# Patient Record
Sex: Male | Born: 1975 | Race: White | Hispanic: No | Marital: Married | Smoking: Never smoker
Health system: Southern US, Community
[De-identification: ages and names within clinical notes are randomized; demographics above are authoritative.]

## PROBLEM LIST (undated history)

## (undated) DIAGNOSIS — T7840XA Allergy, unspecified, initial encounter: Secondary | ICD-10-CM

## (undated) HISTORY — DX: Allergy, unspecified, initial encounter: T78.40XA

---

## 2001-07-15 ENCOUNTER — Ambulatory Visit (HOSPITAL_BASED_OUTPATIENT_CLINIC_OR_DEPARTMENT_OTHER): Admission: RE | Admit: 2001-07-15 | Discharge: 2001-07-15 | Payer: Self-pay | Admitting: General Surgery

## 2001-07-15 ENCOUNTER — Encounter (INDEPENDENT_AMBULATORY_CARE_PROVIDER_SITE_OTHER): Payer: Self-pay | Admitting: *Deleted

## 2010-02-01 ENCOUNTER — Emergency Department (HOSPITAL_BASED_OUTPATIENT_CLINIC_OR_DEPARTMENT_OTHER): Admission: EM | Admit: 2010-02-01 | Discharge: 2010-02-01 | Payer: Self-pay | Admitting: Emergency Medicine

## 2012-08-12 ENCOUNTER — Encounter: Payer: Managed Care, Other (non HMO) | Admitting: Family Medicine

## 2012-08-12 ENCOUNTER — Encounter: Payer: Self-pay | Admitting: Family Medicine

## 2012-08-12 VITALS — BP 119/78 | HR 78 | Temp 97.6°F | Ht 74.0 in | Wt 219.0 lb

## 2012-08-12 NOTE — Progress Notes (Signed)
This encounter was created in error - please disregard.

## 2012-08-13 ENCOUNTER — Ambulatory Visit: Payer: Managed Care, Other (non HMO) | Admitting: Physician Assistant

## 2013-05-19 ENCOUNTER — Other Ambulatory Visit: Payer: Self-pay | Admitting: Family Medicine

## 2013-05-22 NOTE — Telephone Encounter (Signed)
This may be refilled but please have this patient make an appt to be seen

## 2013-05-22 NOTE — Telephone Encounter (Signed)
Patient last seen in office on 01-13-11. Please advise

## 2013-05-26 ENCOUNTER — Ambulatory Visit (INDEPENDENT_AMBULATORY_CARE_PROVIDER_SITE_OTHER): Payer: Managed Care, Other (non HMO) | Admitting: Family Medicine

## 2013-05-26 VITALS — BP 137/77 | HR 81 | Temp 97.1°F | Ht 74.0 in | Wt 223.0 lb

## 2013-05-26 DIAGNOSIS — J3489 Other specified disorders of nose and nasal sinuses: Secondary | ICD-10-CM

## 2013-05-26 DIAGNOSIS — J111 Influenza due to unidentified influenza virus with other respiratory manifestations: Secondary | ICD-10-CM

## 2013-05-26 DIAGNOSIS — R509 Fever, unspecified: Secondary | ICD-10-CM

## 2013-05-26 DIAGNOSIS — R05 Cough: Secondary | ICD-10-CM

## 2013-05-26 DIAGNOSIS — R059 Cough, unspecified: Secondary | ICD-10-CM

## 2013-05-26 DIAGNOSIS — R0981 Nasal congestion: Secondary | ICD-10-CM

## 2013-05-26 LAB — POCT INFLUENZA A/B
Influenza A, POC: NEGATIVE
Influenza B, POC: POSITIVE

## 2013-05-26 MED ORDER — AZITHROMYCIN 250 MG PO TABS: ORAL_TABLET | ORAL | Status: DC

## 2013-05-26 MED ORDER — OSELTAMIVIR PHOSPHATE 75 MG PO CAPS: 75.0000 mg | ORAL_CAPSULE | Freq: Two times a day (BID) | ORAL | Status: DC

## 2013-05-26 NOTE — Progress Notes (Signed)
   Subjective:    Patient ID: Jerome Noble, male    DOB: 07/27/1975, 38 y.o.   MRN: [id number]  HPI  This 38 y.o. male presents for evaluation of cough, uri sx's, and fever.  Review of Systems No chest pain, SOB, HA, dizziness, vision change, N/V, diarrhea, constipation, dysuria, urinary urgency or frequency, myalgias, arthralgias or rash.     Objective:   Physical Exam  Vital signs noted  Well developed well nourished male.  HEENT - Head atraumatic Normocephalic                Eyes - PERRLA, Conjuctiva - clear Sclera- Clear EOMI                Ears - EAC's Wnl TM's Wnl Gross Hearing WNL                Nose - Nares patent                 Throat - oropharanx wnl Respiratory - Lungs CTA bilateral Cardiac - RRR S1 and S2 without murmur GI - Abdomen soft Nontender and bowel sounds active x 4 Extremities - No edema. Neuro - Grossly intact.      Results for orders placed in visit on 05/26/13  POCT INFLUENZA A/B      Result Value Ref Range   Influenza A, POC Negative     Influenza B, POC Positive     Assessment & Plan:  Nasal congestion - Plan: POCT Influenza A/B, oseltamivir (TAMIFLU) 75 MG capsule, azithromycin (ZITHROMAX) 250 MG tablet  Influenza with other respiratory manifestations - Plan: oseltamivir (TAMIFLU) 75 MG capsule, azithromycin (ZITHROMAX) 250 MG tablet  Push po fluids, rest, tylenol and motrin otc prn as directed for fever, arthralgias, and myalgias.  Follow up prn if sx's continue or persist.  Deatra CanterWilliam J Oxford FNP

## 2014-10-22 ENCOUNTER — Telehealth: Payer: Self-pay | Admitting: Family Medicine

## 2014-10-22 NOTE — Telephone Encounter (Signed)
appt scheduled. Patient aware. 

## 2014-10-28 ENCOUNTER — Ambulatory Visit (INDEPENDENT_AMBULATORY_CARE_PROVIDER_SITE_OTHER): Payer: Managed Care, Other (non HMO)

## 2014-10-28 ENCOUNTER — Ambulatory Visit (INDEPENDENT_AMBULATORY_CARE_PROVIDER_SITE_OTHER): Payer: Managed Care, Other (non HMO) | Admitting: Physician Assistant

## 2014-10-28 ENCOUNTER — Encounter: Payer: Self-pay | Admitting: Physician Assistant

## 2014-10-28 VITALS — BP 130/88 | HR 71 | Temp 97.2°F | Ht 74.0 in | Wt 222.0 lb

## 2014-10-28 DIAGNOSIS — R229 Localized swelling, mass and lump, unspecified: Secondary | ICD-10-CM

## 2014-10-28 DIAGNOSIS — M542 Cervicalgia: Secondary | ICD-10-CM

## 2014-10-28 DIAGNOSIS — R222 Localized swelling, mass and lump, trunk: Secondary | ICD-10-CM

## 2014-10-28 MED ORDER — MELOXICAM 15 MG PO TABS: 15.0000 mg | ORAL_TABLET | Freq: Every day | ORAL | Status: DC

## 2014-10-28 MED ORDER — PREDNISONE 10 MG (21) PO TBPK: ORAL_TABLET | ORAL | Status: DC

## 2014-10-28 NOTE — Progress Notes (Signed)
   Subjective:    Patient ID: Jerome Noble, male    DOB: Mar 16, 1976, 39 y.o.   MRN: [id number]  HPI 39 y/o male presents with c/o enlarging mass on left medial shoulder blade and neck pain. He is not having pain today but has had intermittent neck pain since waking up 2 weeks ago. Pain is intermittent. Worse with stretching. Has not tried any medications.     Review of Systems  Constitutional: Negative.   HENT: Negative.   Respiratory: Negative.   Cardiovascular: Negative.   Gastrointestinal: Negative.   Endocrine: Negative.   Genitourinary: Negative.   Musculoskeletal: Positive for neck pain (posterior , bilateral ). Negative for back pain and neck stiffness.  Skin:       Enlarging mass on left back        Objective:   Physical Exam  Constitutional: He is oriented to person, place, and time.  Musculoskeletal: Normal range of motion. He exhibits tenderness (Mild ttp of bilateral paraspinal areas of cervical spine, no abnormalities noted on exam ).  Xray shows mild degenerative changes and decreased joint space. No acute abnormality   Neurological: He is alert and oriented to person, place, and time.  Skin: No erythema.  Palpable, mobile 2cm in diameter subcutaneous mass medial and superior to left scapula. Non ttp   Negative for erythema or discoloration   Psychiatric: He has a normal mood and affect. His behavior is normal. Judgment and thought content normal.          Assessment & Plan:  1. Neck pain  - DG Cervical Spine Complete - predniSONE (STERAPRED UNI-PAK 21 TAB) 10 MG (21) TBPK tablet; 6 pills PO on day 1, 5 on day 2, 4 on day 3, 3 on day 4, 2 on day 5, 1 on day 6  Dispense: 21 tablet; Refill: 0 - meloxicam (MOBIC) 15 MG tablet; Take 1 tablet (15 mg total) by mouth daily.  Dispense: 30 tablet; Refill: 0  2. Mass on back  - DG Cervical Spine Complete   F/u in 2 weeks if no improvement   Kali Ambler A. Chauncey Reading PA-C

## 2015-04-28 ENCOUNTER — Encounter: Payer: Self-pay | Admitting: Family Medicine

## 2015-04-28 ENCOUNTER — Ambulatory Visit (INDEPENDENT_AMBULATORY_CARE_PROVIDER_SITE_OTHER): Payer: Managed Care, Other (non HMO) | Admitting: Family Medicine

## 2015-04-28 VITALS — BP 130/79 | HR 80 | Temp 97.5°F | Ht 74.0 in | Wt 219.0 lb

## 2015-04-28 DIAGNOSIS — J0100 Acute maxillary sinusitis, unspecified: Secondary | ICD-10-CM | POA: Diagnosis not present

## 2015-04-28 MED ORDER — AMOXICILLIN-POT CLAVULANATE 875-125 MG PO TABS: 1.0000 | ORAL_TABLET | Freq: Two times a day (BID) | ORAL | Status: DC

## 2015-04-28 NOTE — Progress Notes (Signed)
   Subjective:    Patient ID: Jerome Noble, male    DOB: 15-Jan-1976, 40 y.o.   MRN: [id number]  HPI Patient here today for sinus drainage and pressure that started about 4-5 days ago. She has a history of allergic rhinitis but thinks he may have infection now with the pressure and headache. He's had no fever. There has been some colored drainage from sinuses at least coughs up. He is planning air plane ride tomorrow to [place] for skiing.      There are no active problems to display for this patient.  Outpatient Encounter Prescriptions as of 04/28/2015  Medication Sig  . [DISCONTINUED] meloxicam (MOBIC) 15 MG tablet Take 1 tablet (15 mg total) by mouth daily.  . [DISCONTINUED] predniSONE (STERAPRED UNI-PAK 21 TAB) 10 MG (21) TBPK tablet 6 pills PO on day 1, 5 on day 2, 4 on day 3, 3 on day 4, 2 on day 5, 1 on day 6   No facility-administered encounter medications on file as of 04/28/2015.      Review of Systems  Constitutional: Negative.  Negative for fever.  HENT: Positive for congestion, postnasal drip and sinus pressure.   Eyes: Negative.   Respiratory: Negative.   Cardiovascular: Negative.   Gastrointestinal: Negative.   Endocrine: Negative.   Genitourinary: Negative.   Musculoskeletal: Negative.   Skin: Negative.   Allergic/Immunologic: Negative.   Neurological: Positive for headaches.  Hematological: Negative.   Psychiatric/Behavioral: Negative.        Objective:   Physical Exam  Constitutional: He appears well-developed and well-nourished.  HENT:  Mouth/Throat: Oropharynx is clear and moist.  Frontal sinuses are tender to percussion  Pulmonary/Chest: Breath sounds normal.    BP 130/79 mmHg  Pulse 80  Temp(Src) 97.5 F (36.4 C) (Oral)  Ht  (1.88 m)  Wt 219 lb (99.338 kg)  BMI 28.11 kg/m2       Assessment & Plan:  1. Acute maxillary sinusitis, recurrence not specified Continue Mucinex. May use Afrin prior to flying. Rx Augmentin 875 twice a day for  10 days  Frederica Kuster MD

## 2015-06-29 ENCOUNTER — Encounter (INDEPENDENT_AMBULATORY_CARE_PROVIDER_SITE_OTHER): Payer: Self-pay

## 2015-06-29 ENCOUNTER — Ambulatory Visit (INDEPENDENT_AMBULATORY_CARE_PROVIDER_SITE_OTHER): Payer: Managed Care, Other (non HMO)

## 2015-06-29 ENCOUNTER — Encounter: Payer: Self-pay | Admitting: Family Medicine

## 2015-06-29 ENCOUNTER — Ambulatory Visit (INDEPENDENT_AMBULATORY_CARE_PROVIDER_SITE_OTHER): Payer: Managed Care, Other (non HMO) | Admitting: Family Medicine

## 2015-06-29 VITALS — BP 127/80 | HR 73 | Temp 97.0°F | Ht 74.0 in | Wt 222.6 lb

## 2015-06-29 DIAGNOSIS — L03113 Cellulitis of right upper limb: Secondary | ICD-10-CM

## 2015-06-29 DIAGNOSIS — M7021 Olecranon bursitis, right elbow: Secondary | ICD-10-CM | POA: Diagnosis not present

## 2015-06-29 DIAGNOSIS — M25521 Pain in right elbow: Secondary | ICD-10-CM

## 2015-06-29 MED ORDER — SULFAMETHOXAZOLE-TRIMETHOPRIM 800-160 MG PO TABS: 1.0000 | ORAL_TABLET | Freq: Two times a day (BID) | ORAL | Status: DC

## 2015-06-29 MED ORDER — PREDNISONE 20 MG PO TABS: ORAL_TABLET | ORAL | Status: DC

## 2015-06-29 NOTE — Progress Notes (Signed)
BP 127/80 mmHg  Pulse 73  Temp(Src) 97 F (36.1 C) (Oral)  Ht  (1.88 m)  Wt 222 lb 9.6 oz (100.971 kg)  BMI 28.57 kg/m2   Subjective:    Patient ID: Jerome Noble, male    DOB: 1975-09-20, 40 y.o.   MRN: [id number]  HPI: Jerome Noble is a 40 y.o. male presenting on 06/29/2015 for Right elbow pain   HPI Right elbow pain Patient has been having a small amount of right elbow pain for the past 2 weeks since a skiing incident where he fell directly onto that right elbow. He has had more significant swelling and pain increasing over the past 2-3 days. He has also developed some redness and warmth over that elbow. The swelling and warmth and pain is on the extensor surface of his elbow. He denies any fevers or chills or skin changes anywhere else. He denies any difficulties with range of motion and there but it does hurt to fully extend it because of the swelling. He denies any pain anywhere else.  Relevant past medical, surgical, family and social history reviewed and updated as indicated. Interim medical history since our last visit reviewed. Allergies and medications reviewed and updated.  Review of Systems  Constitutional: Negative for fever.  HENT: Negative for ear discharge and ear pain.   Eyes: Negative for discharge and visual disturbance.  Respiratory: Negative for shortness of breath and wheezing.   Cardiovascular: Negative for chest pain and leg swelling.  Gastrointestinal: Negative for abdominal pain, diarrhea and constipation.  Genitourinary: Negative for difficulty urinating.  Musculoskeletal: Positive for joint swelling and arthralgias. Negative for back pain and gait problem.  Skin: Positive for color change. Negative for rash and wound.  Neurological: Negative for syncope, light-headedness and headaches.  All other systems reviewed and are negative.   Per HPI unless specifically indicated above     Medication List       This list is accurate as of: 06/29/15  5:02  PM.  Always use your most recent med list.               predniSONE 20 MG tablet  Commonly known as:  DELTASONE  2 po at same time daily for 5 days     sulfamethoxazole-trimethoprim 800-160 MG tablet  Commonly known as:  BACTRIM DS  Take 1 tablet by mouth 2 (two) times daily.           Objective:    BP 127/80 mmHg  Pulse 73  Temp(Src) 97 F (36.1 C) (Oral)  Ht  (1.88 m)  Wt 222 lb 9.6 oz (100.971 kg)  BMI 28.57 kg/m2  Wt Readings from Last 3 Encounters:  06/29/15 222 lb 9.6 oz (100.971 kg)  04/28/15 219 lb (99.338 kg)  10/28/14 222 lb (100.699 kg)    Physical Exam  Constitutional: He is oriented to person, place, and time. He appears well-developed and well-nourished. No distress.  Eyes: Conjunctivae and EOM are normal. Pupils are equal, round, and reactive to light. Right eye exhibits no discharge. No scleral icterus.  Cardiovascular: Normal rate, regular rhythm, normal heart sounds and intact distal pulses.   No murmur heard. Pulmonary/Chest: Effort normal and breath sounds normal. No respiratory distress. He has no wheezes.  Musculoskeletal: Normal range of motion. He exhibits no edema.       Right elbow: He exhibits swelling. He exhibits normal range of motion, no deformity and no laceration. Tenderness found. Olecranon process (Tenderness overlying the olecranon  process, also the location of the bursa sac, significant swelling and erythema and warmth noted as well.) tenderness noted.  Neurological: He is alert and oriented to person, place, and time. Coordination normal.  Skin: Skin is warm and dry. No rash noted. He is not diaphoretic.  Psychiatric: He has a normal mood and affect. His behavior is normal.  Vitals reviewed.   Elbow x-ray: No acute bony abnormalities are noted. Soft tissue edema and swelling is noted on x-ray. Await radiologist's final read.    Assessment & Plan:   Problem List Items Addressed This Visit    None    Visit Diagnoses     Elbow pain, right    -  Primary    Relevant Orders    DG Elbow 2 Views Right    Bursitis of elbow, right        Concern for bursitis versus cellulitis, will send prednisone and antibiotic    Relevant Medications    sulfamethoxazole-trimethoprim (BACTRIM DS) 800-160 MG tablet    predniSONE (DELTASONE) 20 MG tablet    Cellulitis of right upper extremity        Relevant Medications    sulfamethoxazole-trimethoprim (BACTRIM DS) 800-160 MG tablet    predniSONE (DELTASONE) 20 MG tablet       Follow up plan: Return if symptoms worsen or fail to improve.  Counseling provided for all of the vaccine components Orders Placed This Encounter  Procedures  . DG Elbow 2 Views Right    Arville CareJoshua Chanetta Moosman, MD [facility] 06/29/2015, 5:02 PM

## 2017-04-02 IMAGING — CR DG CERVICAL SPINE COMPLETE 4+V
5 series · 5 of 5 positions shown · non-contrast
Comparison: None in PACs

CLINICAL DATA: Neck pain without

EXAM:
CERVICAL SPINE  4+ VIEWS

[view not recorded (1 of 5)]
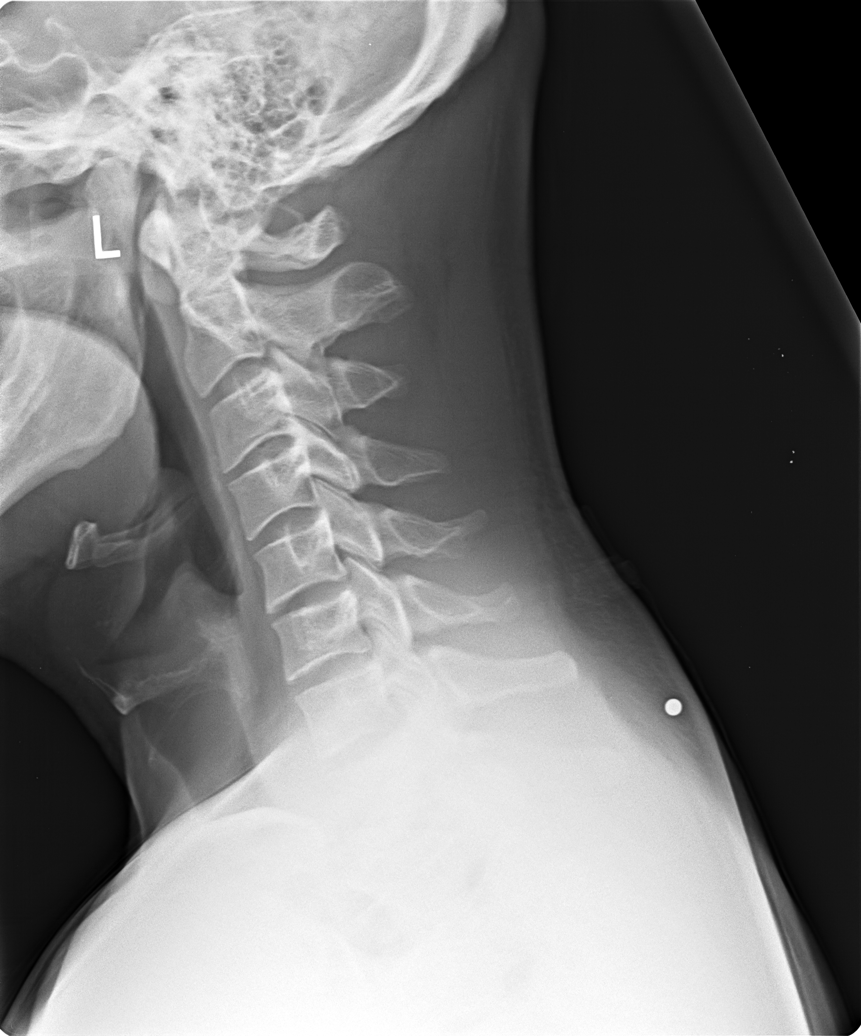

[view not recorded (2 of 5)]
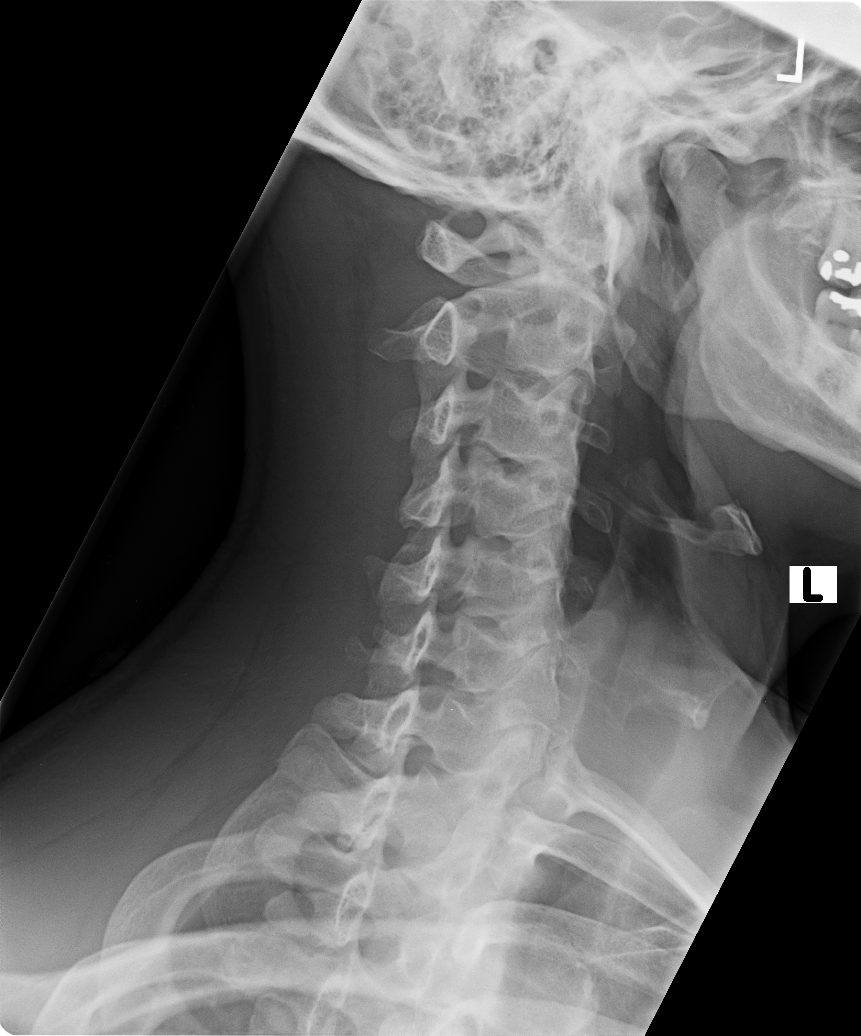

[view not recorded (3 of 5)]
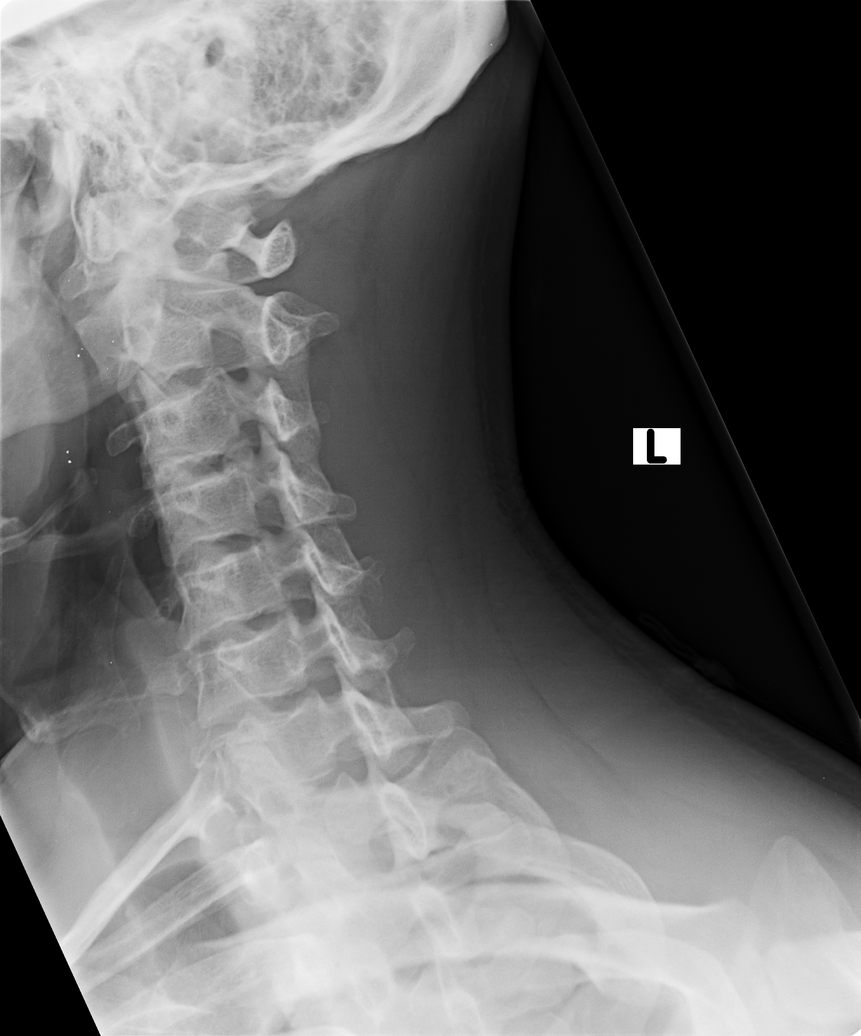

[view not recorded (4 of 5)]
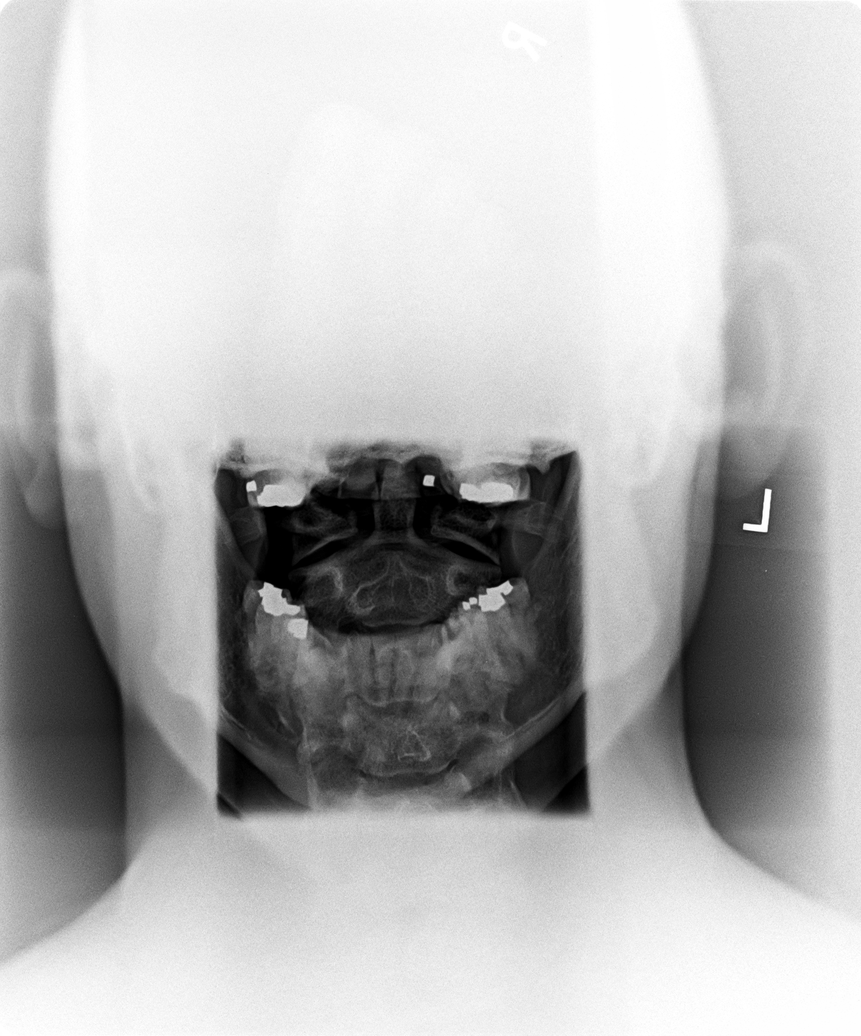

[view not recorded (5 of 5)]
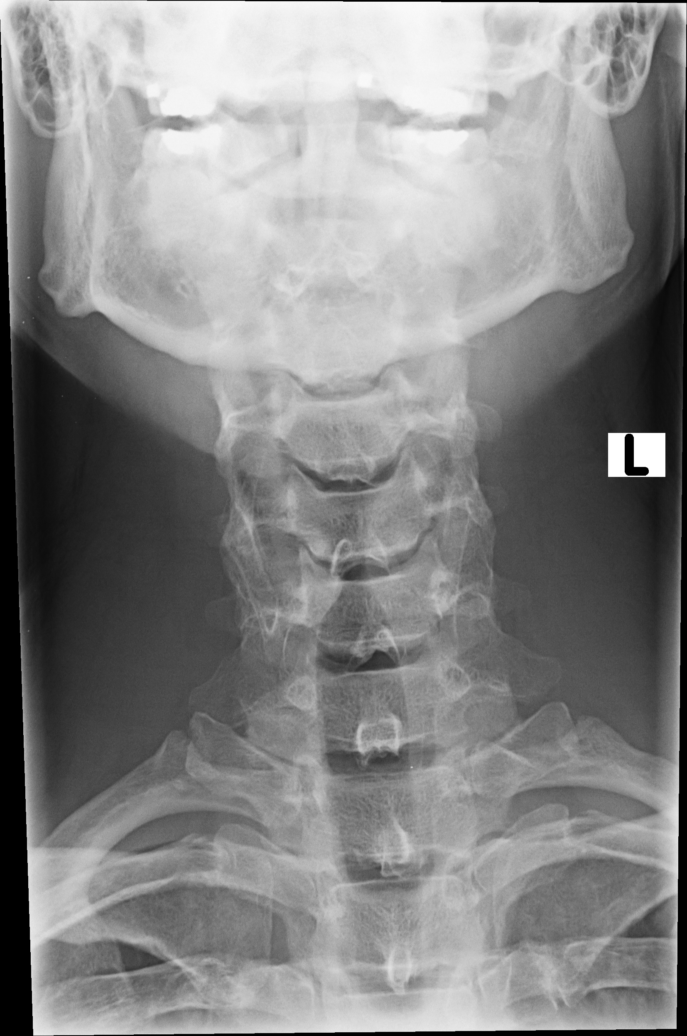

[5 of 5 positions shown; findings below may reference images not displayed]

FINDINGS: There is mild loss of the normal cervical lordosis. The cervical
vertebral bodies are preserved in height. The disc space heights are
reasonably well-maintained. There is no perched facet. The oblique
views reveal mild bony encroachment upon the C3 neural foramen
bilaterally. The prevertebral soft tissue spaces are normal. The
odontoid is intact
IMPRESSION: Mild encroachment upon the neural foramina at the C3 level
bilaterally. Mild loss of the normal cervical lordosis suggests
muscle spasm.

If there are radicular symptoms, further evaluation with MRI would
be useful.

## 2017-09-09 ENCOUNTER — Ambulatory Visit: Payer: Managed Care, Other (non HMO) | Admitting: Family Medicine

## 2017-09-09 ENCOUNTER — Encounter: Payer: Self-pay | Admitting: Family Medicine

## 2017-09-09 VITALS — BP 99/66 | HR 82 | Temp 97.5°F | Ht 74.0 in | Wt 221.0 lb

## 2017-09-09 DIAGNOSIS — M545 Low back pain, unspecified: Secondary | ICD-10-CM

## 2017-09-09 MED ORDER — PREDNISONE 10 MG PO TABS: ORAL_TABLET | ORAL | 0 refills | Status: DC

## 2017-09-09 NOTE — Patient Instructions (Signed)
Great to see you!  Let me know if you are not getting better.

## 2017-09-09 NOTE — Progress Notes (Signed)
   HPI  Patient presents today for back pain.  Patient explains about 3 to 4 weeks ago he began having midline low back pain.  He states that it is in one area and has intermittent sharp shooting pains 6-8 times a day.  He does not have any leg symptoms or leg weakness. No injury. He states that this typically comes on a couple times a year and last no more than a day or 2, however it lasted 3 or 4 weeks this time.  It has still come and gone some in 3 or 4 weeks but over the last 3 days has been present most of the time.  He has worsening pain with rotating his back in a very specific way.  No pain with flexion or extension.  PMH: Smoking status noted ROS: Per HPI  Objective: BP 99/66 (BP Location: Right Arm, Patient Position: Sitting, Cuff Size: Normal)   Pulse 82   Temp (!) 97.5 F (36.4 C) (Oral)   Ht 6\' 2"  (1.88 m)   Wt 221 lb (100.2 kg)   BMI 28.37 kg/m  Gen: NAD, alert, cooperative with exam HEENT: NCAT CV: RRR, good S1/S2, no murmur Resp: CTABL, no wheezes, non-labored Ext: No edema, warm Neuro: Alert and oriented, No gross deficits MSK:  No midline tenderness to palpation of the lumbar spine, no paraspinal muscle tenderness to palpation Negative Pearlean BrownieFaber testing bilateral hips Negative modified straight leg raise Strength preserved bilateral lower extremities  Assessment and plan:  #Acute midline low back pain Recent onset persistent back pain with mostly intermittent bursts of sharp pain that are nonradiating. Not helped by 800 mg ibuprofen No injury He has seen his chiropractor who did an x-ray, he was told there were no broken bones. He's had short-lived improvement after chiropractic adjustment. 12-day course of prednisone given   Meds ordered this encounter  Medications  . predniSONE (DELTASONE) 10 MG tablet    Sig: Take 4 pills a day for 3 days, then 3 pills a day for 3 days, then 2 pills a day for 3 days, then 1 pill a day for 3 days, then stop   Dispense:  30 tablet    Refill:  0    Murtis SinkSam Teneil Shiller, MD [facility] 09/09/2017, 2:28 PM

## 2018-09-04 ENCOUNTER — Encounter (INDEPENDENT_AMBULATORY_CARE_PROVIDER_SITE_OTHER): Payer: Self-pay

## 2021-03-26 ENCOUNTER — Other Ambulatory Visit: Payer: Self-pay

## 2021-03-26 ENCOUNTER — Ambulatory Visit
Admission: EM | Admit: 2021-03-26 | Discharge: 2021-03-26 | Disposition: A | Discharge status: Home / Self Care | Payer: Managed Care, Other (non HMO) | Attending: Family Medicine | Admitting: Family Medicine

## 2021-03-26 DIAGNOSIS — J029 Acute pharyngitis, unspecified: Secondary | ICD-10-CM | POA: Diagnosis not present

## 2021-03-26 DIAGNOSIS — J069 Acute upper respiratory infection, unspecified: Secondary | ICD-10-CM | POA: Insufficient documentation

## 2021-03-26 DIAGNOSIS — R52 Pain, unspecified: Secondary | ICD-10-CM | POA: Insufficient documentation

## 2021-03-26 LAB — POCT RAPID STREP A (OFFICE): Rapid Strep A Screen: NEGATIVE

## 2021-03-26 MED ORDER — PREDNISONE 20 MG PO TABS: 40.0000 mg | ORAL_TABLET | Freq: Every day | ORAL | 0 refills | Status: DC

## 2021-03-26 MED ORDER — LIDOCAINE VISCOUS HCL 2 % MT SOLN: 10.0000 mL | OROMUCOSAL | 0 refills | Status: DC | PRN

## 2021-03-26 NOTE — ED Triage Notes (Signed)
Pt presents with sore throat for past 3 days , reports white patches on throat

## 2021-03-28 LAB — CULTURE, GROUP A STREP (THRC)

## 2021-03-30 NOTE — ED Provider Notes (Signed)
[facility]    CSN: [id number] Arrival date & time: 03/26/21  1210      History   Chief Complaint Chief Complaint  Patient presents with   Sore Throat    HPI Jerome Noble is a 46 y.o. male.   Presenting today with sore, swollen feeling throat with white patches x 3 days. Denies congestion, cough, fever, chills, body aches, CP, SOB. Not trying anything OTC for sxs. No known sick contacts.    Past Medical History:  Diagnosis Date   Allergy     There are no problems to display for this patient.   History reviewed. No pertinent surgical history.     Home Medications    Prior to Admission medications   Medication Sig Start Date End Date Taking? Authorizing Provider  lidocaine (XYLOCAINE) 2 % solution Use as directed 10 mLs in the mouth or throat every 3 (three) hours as needed for mouth pain. 03/26/21  Yes Volney American, PA-C  predniSONE (DELTASONE) 20 MG tablet Take 2 tablets (40 mg total) by mouth daily with breakfast. 03/26/21  Yes Volney American, PA-C  predniSONE (DELTASONE) 10 MG tablet Take 4 pills a day for 3 days, then 3 pills a day for 3 days, then 2 pills a day for 3 days, then 1 pill a day for 3 days, then stop 09/09/17   Timmothy Euler, MD    Family History Family History  Problem Relation Age of Onset   Heart disease Mother    Hyperlipidemia Mother     Social History Social History   Tobacco Use   Smoking status: Never   Smokeless tobacco: Former  Substance Use Topics   Alcohol use: Yes    Alcohol/week: 20.0 standard drinks    Types: 20 Cans of beer per week   Drug use: No     Allergies   Azelastine hcl and Flonase [fluticasone propionate]   Review of Systems Review of Systems PER HPI  Physical Exam Triage Vital Signs ED Triage Vitals  Enc Vitals Group     BP 03/26/21 1414 (!) 146/86     Pulse Rate 03/26/21 1414 67     Resp 03/26/21 1414 20     Temp 03/26/21 1414 98.1 F (36.7 C)     Temp src --       SpO2 03/26/21 1414 96 %     Weight --      Height --      Head Circumference --      Peak Flow --      Pain Score 03/26/21 1410 7     Pain Loc --      Pain Edu? --      Excl. in [place]? --    No data found.  Updated Vital Signs BP (!) 146/86    Pulse 67    Temp 98.1 F (36.7 C)    Resp 20    SpO2 96%   Visual Acuity Right Eye Distance:   Left Eye Distance:   Bilateral Distance:    Right Eye Near:   Left Eye Near:    Bilateral Near:     Physical Exam Vitals and nursing note reviewed.  Constitutional:      Appearance: Normal appearance. He is well-developed.  HENT:     Head: Atraumatic.     Right Ear: External ear normal.     Left Ear: External ear normal.     Nose: Nose normal.     Mouth/Throat:  Mouth: Mucous membranes are moist.     Pharynx: Posterior oropharyngeal erythema present. No oropharyngeal exudate.  Eyes:     Extraocular Movements: Extraocular movements intact.     Conjunctiva/sclera: Conjunctivae normal.     Pupils: Pupils are equal, round, and reactive to light.  Cardiovascular:     Rate and Rhythm: Normal rate and regular rhythm.  Pulmonary:     Effort: Pulmonary effort is normal. No respiratory distress.     Breath sounds: Normal breath sounds. No rales.  Musculoskeletal:        General: Normal range of motion.     Cervical back: Normal range of motion and neck supple.  Lymphadenopathy:     Cervical: No cervical adenopathy.  Skin:    General: Skin is warm and dry.  Neurological:     General: No focal deficit present.     Mental Status: He is alert and oriented to person, place, and time.  Psychiatric:        Mood and Affect: Mood normal.        Behavior: Behavior normal.        Thought Content: Thought content normal.        Judgment: Judgment normal.     UC Treatments / Results  Labs (all labs ordered are listed, but only abnormal results are displayed) Labs Reviewed  CULTURE, GROUP A STREP [facility])  POCT RAPID STREP A (OFFICE)     EKG   Radiology No results found.  Procedures Procedures (including critical care time)  Medications Ordered in UC Medications - No data to display  Initial Impression / Assessment and Plan / UC Course  I have reviewed the triage vital signs and the nursing notes.  Pertinent labs & imaging results that were available during my care of the patient were reviewed by me and considered in my medical decision making (see chart for details).     Rapid strep neg, throat culture pending. Treat with prednisone, viscous lidocaine and monitor for benefit. Return for worsening sxs.  Final Clinical Impressions(s) / UC Diagnoses   Final diagnoses:  Sore throat  Viral URI  Body aches   Discharge Instructions   None    ED Prescriptions     Medication Sig Dispense Auth. Provider   predniSONE (DELTASONE) 20 MG tablet Take 2 tablets (40 mg total) by mouth daily with breakfast. 6 tablet Volney American, PA-C   lidocaine (XYLOCAINE) 2 % solution Use as directed 10 mLs in the mouth or throat every 3 (three) hours as needed for mouth pain. 100 mL Volney American, [place]      PDMP not reviewed this encounter.   Volney American, [place] 03/30/21 2326

## 2022-11-24 NOTE — Progress Notes (Unsigned)
New Patient Office Visit  Subjective    Patient ID: Jerome Noble, male    DOB: 1975-07-05  Age: 47 y.o. MRN: [id number]  CC: No chief complaint on file.   HPI Jerome Noble presents to establish care ***  Outpatient Encounter Medications as of 11/27/2022  Medication Sig   lidocaine (XYLOCAINE) 2 % solution Use as directed 10 mLs in the mouth or throat every 3 (three) hours as needed for mouth pain.   predniSONE (DELTASONE) 10 MG tablet Take 4 pills a day for 3 days, then 3 pills a day for 3 days, then 2 pills a day for 3 days, then 1 pill a day for 3 days, then stop   predniSONE (DELTASONE) 20 MG tablet Take 2 tablets (40 mg total) by mouth daily with breakfast.   No facility-administered encounter medications on file as of 11/27/2022.    Past Medical History:  Diagnosis Date   Allergy     No past surgical history on file.  Family History  Problem Relation Age of Onset   Heart disease Mother    Hyperlipidemia Mother     Social History   Socioeconomic History   Marital status: Married    Spouse name: Not on file   Number of children: Not on file   Years of education: Not on file   Highest education level: Not on file  Occupational History   Not on file  Tobacco Use   Smoking status: Never   Smokeless tobacco: Former  Substance and Sexual Activity   Alcohol use: Yes    Alcohol/week: 20.0 standard drinks of alcohol    Types: 20 Cans of beer per week   Drug use: No   Sexual activity: Not on file  Other Topics Concern   Not on file  Social History Narrative   Not on file   Social Determinants of Health   Financial Resource Strain: Not on file  Food Insecurity: Not on file  Transportation Needs: Not on file  Physical Activity: Not on file  Stress: Not on file  Social Connections: Unknown (08/06/2021)   [facility]   Social Network    Social Network: Not on file  Intimate Partner Violence: Unknown (06/28/2021)   Received from [facility]    HITS    Physically Hurt: Not on file    Insult or Talk Down To: Not on file    Threaten Physical Harm: Not on file    Scream or Curse: Not on file    ROS Negative unless indicated in HPI   Objective    There were no vitals taken for this visit.  Physical Exam     Assessment & Plan:  There are no diagnoses linked to this encounter.      Continue healthy lifestyle choices, including diet (rich in fruits, vegetables, and lean proteins, and low in salt and simple carbohydrates) and exercise (at least 30 minutes of moderate physical activity daily).     The above assessment and management plan was discussed with the patient. The patient verbalized understanding of and has agreed to the management plan. Patient is aware to call the clinic if they develop any new symptoms or if symptoms persist or worsen. Patient is aware when to return to the clinic for a follow-up visit. Patient educated on when it is appropriate to go to the emergency department.  No follow-ups on file.   Arrie Aran Santa Lighter, [place] [facility] [address]  [place], [place] [address] [phone]

## 2022-11-27 ENCOUNTER — Ambulatory Visit: Payer: Commercial Managed Care - HMO | Admitting: Nurse Practitioner

## 2022-11-27 ENCOUNTER — Encounter: Payer: Self-pay | Admitting: Nurse Practitioner

## 2022-11-27 VITALS — BP 125/81 | HR 84 | Temp 98.2°F | Ht 74.0 in | Wt 224.8 lb

## 2022-11-27 DIAGNOSIS — Z0000 Encounter for general adult medical examination without abnormal findings: Secondary | ICD-10-CM

## 2022-11-27 DIAGNOSIS — L853 Xerosis cutis: Secondary | ICD-10-CM

## 2022-11-27 DIAGNOSIS — Z1211 Encounter for screening for malignant neoplasm of colon: Secondary | ICD-10-CM

## 2022-11-27 MED ORDER — CLOTRIMAZOLE 1 % EX CREA: 1.0000 | TOPICAL_CREAM | Freq: Two times a day (BID) | CUTANEOUS | 0 refills | Status: AC

## 2022-11-28 ENCOUNTER — Encounter: Payer: Self-pay | Admitting: Nurse Practitioner

## 2022-11-28 LAB — LIPID PANEL
Chol/HDL Ratio: 3.1 ratio (ref 0.0–5.0)
Cholesterol, Total: 193 mg/dL (ref 100–199)
HDL: 62 mg/dL (ref >39)
LDL Chol Calc (NIH): 108 mg/dL — ABNORMAL HIGH (ref 0–99)
Triglycerides: 134 mg/dL (ref 0–149)
VLDL Cholesterol Cal: 23 mg/dL (ref 5–40)

## 2022-11-28 LAB — CMP14+EGFR
ALT: 18 IU/L (ref 0–44)
AST: 19 IU/L (ref 0–40)
Albumin: 4.4 g/dL (ref 4.1–5.1)
Alkaline Phosphatase: 62 IU/L (ref 44–121)
BUN/Creatinine Ratio: 15 (ref 9–20)
BUN: 17 mg/dL (ref 6–24)
Bilirubin Total: 0.3 mg/dL (ref 0.0–1.2)
CO2: 24 mmol/L (ref 20–29)
Calcium: 9.3 mg/dL (ref 8.7–10.2)
Chloride: 103 mmol/L (ref 96–106)
Creatinine, Ser: 1.16 mg/dL (ref 0.76–1.27)
Globulin, Total: 2 g/dL (ref 1.5–4.5)
Glucose: 101 mg/dL — ABNORMAL HIGH (ref 70–99)
Potassium: 4.4 mmol/L (ref 3.5–5.2)
Sodium: 140 mmol/L (ref 134–144)
Total Protein: 6.4 g/dL (ref 6.0–8.5)
eGFR: 78 mL/min/{1.73_m2} (ref >59)

## 2022-11-28 LAB — CBC WITH DIFFERENTIAL/PLATELET
Basophils Absolute: 0 10*3/uL (ref 0.0–0.2)
Basos: 1 %
EOS (ABSOLUTE): 0.1 10*3/uL (ref 0.0–0.4)
Eos: 2 %
Hematocrit: 41.2 % (ref 37.5–51.0)
Hemoglobin: 14.3 g/dL (ref 13.0–17.7)
Immature Grans (Abs): 0 10*3/uL (ref 0.0–0.1)
Immature Granulocytes: 0 %
Lymphocytes Absolute: 1.4 10*3/uL (ref 0.7–3.1)
Lymphs: 36 %
MCH: 29.5 pg (ref 26.6–33.0)
MCHC: 34.7 g/dL (ref 31.5–35.7)
MCV: 85 fL (ref 79–97)
Monocytes Absolute: 0.5 10*3/uL (ref 0.1–0.9)
Monocytes: 12 %
Neutrophils Absolute: 2 10*3/uL (ref 1.4–7.0)
Neutrophils: 49 %
Platelets: 273 10*3/uL (ref 150–450)
RBC: 4.84 x10E6/uL (ref 4.14–5.80)
RDW: 12.3 % (ref 11.6–15.4)
WBC: 3.9 10*3/uL (ref 3.4–10.8)

## 2022-11-28 LAB — THYROID PANEL WITH TSH
Free Thyroxine Index: 1.1 — ABNORMAL LOW (ref 1.2–4.9)
T3 Uptake Ratio: 26 % (ref 24–39)
T4, Total: 4.4 ug/dL — ABNORMAL LOW (ref 4.5–12.0)
TSH: 1.92 u[IU]/mL (ref 0.450–4.500)

## 2022-11-28 LAB — PSA, TOTAL AND FREE
PSA, Free Pct: 40 %
PSA, Free: 0.24 ng/mL
Prostate Specific Ag, Serum: 0.6 ng/mL (ref 0.0–4.0)

## 2023-01-14 ENCOUNTER — Encounter: Payer: Self-pay | Admitting: Nurse Practitioner

## 2023-01-14 ENCOUNTER — Ambulatory Visit: Payer: Managed Care, Other (non HMO) | Admitting: Nurse Practitioner

## 2023-01-14 VITALS — BP 130/79 | HR 64 | Temp 97.7°F | Ht 74.0 in | Wt 221.6 lb

## 2023-01-14 DIAGNOSIS — L853 Xerosis cutis: Secondary | ICD-10-CM | POA: Insufficient documentation

## 2023-01-14 MED ORDER — TACROLIMUS 0.1 % EX OINT: TOPICAL_OINTMENT | Freq: Two times a day (BID) | CUTANEOUS | 0 refills | Status: DC

## 2023-01-14 NOTE — Progress Notes (Signed)
Established Patient Office Visit  Subjective   Patient ID: Jerome Noble, male    DOB: [birth year]  Age: 47 y.o. MRN: [id number]  Chief Complaint  Patient presents with   Medical Management of Chronic Issues    6 week    HPI Jerome Noble presents  is 47 yrs old male present 01/14/2023 for 6-weeks follow for xerosis cutis. He was seen 6-weeks and prescribed Clotrimazole for and reports that he has not get any relief with it. Jerome Noble is worst at night. 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 He is agreeable to be referred to [facility]. 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 No pertinent surgical history. Social History   Tobacco Use   Smoking status: Never   Smokeless tobacco: Current    Types: Chew  Vaping Use   Vaping status: Never Used  Substance Use Topics   Alcohol use: Yes    Alcohol/week: 20.0 standard drinks of alcohol    Types: 20 Cans of beer per week   Drug use: No   Social History   Socioeconomic History   Marital status: Married    Spouse name: Not on file   Number of children: Not on file   Years of education: Not on file   Highest education level: Not on file  Occupational History   Not on file  Tobacco Use   Smoking status: Never   Smokeless tobacco: Current    Types: Chew  Vaping Use   Vaping status: Never Used  Substance and Sexual Activity   Alcohol use: Yes    Alcohol/week: 20.0 standard drinks of alcohol    Types: 20 Cans of beer per week   Drug use: No   Sexual activity: Not on file  Other Topics Concern   Not on file  Social History Narrative   Not on file   Social Determinants of Health   Financial Resource Strain: Low Risk  (11/27/2022)   Overall Financial Resource Strain (CARDIA)    Difficulty of Paying Living Expenses: Not hard at all  Food Insecurity: No Food Insecurity (11/27/2022)   Hunger Vital Sign    Worried  About Running Out of Food in the Last Year: Never true    Ran Out of Food in the Last Year: Never true  Transportation Needs: Unknown (11/27/2022)   PRAPARE - Administrator, Civil Service (Medical): Not on file    Lack of Transportation (Non-Medical): No  Physical Activity: Not on file  Stress: Not on file  Social Connections: Unknown (08/06/2021)   [facility] from [facility]   Social Network    Social Network: Not on file  Intimate Partner Violence: Not At Risk (11/27/2022)   Humiliation, Afraid, Rape, and Kick questionnaire    Fear of Current or Ex-Partner: No    Emotionally Abused: No    Physically Abused: No    Sexually Abused: No   Family Status  Relation Name Status   Mother  Alive   Father  Alive   Brother  Alive   Brother  Alive  No partnership data on file   Family History  Problem Relation Age of Onset   Heart disease Mother    Hyperlipidemia Mother    Allergies  Allergen Reactions   Azelastine Hcl Hives   Flonase [Fluticasone Propionate] Hives      Review  of Systems  Eyes:  Negative for pain.  Respiratory:  Negative for cough and wheezing.   Cardiovascular:  Negative for chest pain and leg swelling.  Skin:  Positive for itching and rash.  Neurological:  Negative for dizziness and headaches.  All other systems reviewed and are negative.  Negative unless indicated in HPI   Objective:     BP 130/79   Pulse 64   Temp 97.7 F (36.5 C) (Temporal)   Ht 6\' 2"  (1.88 m)   Wt 221 lb 9.6 oz (100.5 kg)   SpO2 98%   BMI 28.45 kg/m  BP Readings from Last 3 Encounters:  01/14/23 130/79  11/27/22 125/81  03/26/21 (!) 146/86   Wt Readings from Last 3 Encounters:  01/14/23 221 lb 9.6 oz (100.5 kg)  11/27/22 224 lb 12.8 oz (102 kg)  09/09/17 221 lb (100.2 kg)      Physical Exam Vitals and nursing note reviewed.  Constitutional:      Appearance: Normal appearance.  HENT:     Head: Normocephalic and atraumatic.  Eyes:     General: No  scleral icterus.    Extraocular Movements: Extraocular movements intact.     Conjunctiva/sclera: Conjunctivae normal.     Pupils: Pupils are equal, round, and reactive to light.  Cardiovascular:     Rate and Rhythm: Normal rate and regular rhythm.  Pulmonary:     Effort: Pulmonary effort is normal.     Breath sounds: Normal breath sounds.  Musculoskeletal:        General: Normal range of motion.     Cervical back: Normal range of motion and neck supple.     Right lower leg: No edema.     Left lower leg: No edema.  Skin:    General: Skin is cool.     Findings: Rash present. Rash is scaling.  Neurological:     Mental Status: He is alert. Mental status is at baseline.      No results found for any visits on 01/14/23.  Last CBC Lab Results  Component Value Date   WBC 3.9 11/27/2022   HGB 14.3 11/27/2022   HCT 41.2 11/27/2022   MCV 85 11/27/2022   MCH 29.5 11/27/2022   RDW 12.3 11/27/2022   PLT 273 11/27/2022   Last metabolic panel Lab Results  Component Value Date   GLUCOSE 101 (H) 11/27/2022   NA 140 11/27/2022   K 4.4 11/27/2022   CL 103 11/27/2022   CO2 24 11/27/2022   BUN 17 11/27/2022   CREATININE 1.16 11/27/2022   EGFR 78 11/27/2022   CALCIUM 9.3 11/27/2022   PROT 6.4 11/27/2022   ALBUMIN 4.4 11/27/2022   LABGLOB 2.0 11/27/2022   BILITOT 0.3 11/27/2022   ALKPHOS 62 11/27/2022   AST 19 11/27/2022   ALT 18 11/27/2022   Last lipids Lab Results  Component Value Date   CHOL 193 11/27/2022   HDL 62 11/27/2022   LDLCALC 108 (H) 11/27/2022   TRIG 134 11/27/2022   CHOLHDL 3.1 11/27/2022   Last hemoglobin A1c No results found for: "HGBA1C" Last thyroid functions Lab Results  Component Value Date   TSH 1.920 11/27/2022   T4TOTAL 4.4 (L) 11/27/2022        Assessment & Plan:  Xerosis cutis -     Tacrolimus; Apply topically 2 (two) times daily.  Dispense: 110.01 g; Refill: 0 -     Ambulatory referral to Dermatology  Jerome Noble is a 47 yrs old Caucasian  male,  no acute distress Continue Clotrimazole Add Tacrolimus ointment while he is waiting to be seen by dermatology  Encourage healthy lifestyle choices, including diet (rich in fruits, vegetables, and lean proteins, and low in salt and simple carbohydrates) and exercise (at least 30 minutes of moderate physical activity daily).     The above assessment and management plan was discussed with the patient. The patient verbalized understanding of and has agreed to the management plan. Patient is aware to call the clinic if they develop any new symptoms or if symptoms persist or worsen. Patient is aware when to return to the clinic for a follow-up visit. Patient educated on when it is appropriate to go to the emergency department.  Return in about 2 months (around 03/16/2023).

## 2023-01-30 ENCOUNTER — Ambulatory Visit: Payer: Self-pay | Admitting: Nurse Practitioner

## 2023-02-12 ENCOUNTER — Other Ambulatory Visit: Payer: Self-pay | Admitting: Nurse Practitioner

## 2023-02-12 DIAGNOSIS — L853 Xerosis cutis: Secondary | ICD-10-CM

## 2023-02-27 ENCOUNTER — Ambulatory Visit: Payer: Commercial Managed Care - HMO | Admitting: Dermatology

## 2023-02-27 ENCOUNTER — Encounter: Payer: Self-pay | Admitting: Dermatology

## 2023-02-27 DIAGNOSIS — R21 Rash and other nonspecific skin eruption: Secondary | ICD-10-CM | POA: Diagnosis not present

## 2023-02-27 DIAGNOSIS — Z7189 Other specified counseling: Secondary | ICD-10-CM

## 2023-02-27 DIAGNOSIS — L853 Xerosis cutis: Secondary | ICD-10-CM

## 2023-02-27 DIAGNOSIS — L309 Dermatitis, unspecified: Secondary | ICD-10-CM | POA: Insufficient documentation

## 2023-02-27 MED ORDER — CLOBETASOL PROPIONATE 0.05 % EX OINT: 1.0000 | TOPICAL_OINTMENT | Freq: Two times a day (BID) | CUTANEOUS | 11 refills | Status: AC

## 2023-02-27 NOTE — Progress Notes (Signed)
   New Patient Visit   Subjective  Jerome Noble is a 47 y.o. male who presents for the following: cracking and peeling at right hand and fingers, started around February. Patient was prescribed tacrolimus and used it for a few weeks but it did not help. Patient advises it hurts bad. Patient does Holiday representative work, Secretary/administrator.   The patient has spots, moles and lesions to be evaluated, some may be new or changing and the patient may have concern these could be cancer.   The following portions of the chart were reviewed this encounter and updated as appropriate: medications, allergies, medical history  Review of Systems:  No other skin or systemic complaints except as noted in HPI or Assessment and Plan.  Objective  Well appearing patient in no apparent distress; mood and affect are within normal limits.   A focused examination was performed of the following areas: hands  Relevant exam findings are noted in the Assessment and Plan.    Assessment & Plan   Rash Exam: xerotic scaly plaques with severe fissure of palmar fingers on right hand. 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 Recommend wearing cotton gloves after applying ointment at bedtime. Avoid applying to face, groin, and axilla. Use as directed. Long-term use can cause thinning of the skin.  Consider Dupixent if indicated.  Topical steroids (such as triamcinolone, fluocinolone, fluocinonide, mometasone, clobetasol, halobetasol, betamethasone, hydrocortisone) can cause thinning and lightening of the skin if they are used for too long in the same area. Your physician has selected the right strength medicine for your problem and area affected on the body. Please use your medication only as directed by your physician to prevent  side effects.    Return in about 4 weeks (around 03/27/2023) for Rash, check tops of ears.  Anise Salvo, RMA, am acting as scribe for Elie Goody, MD .   Documentation: I have reviewed the above documentation for accuracy and completeness, and I agree with the above.  Elie Goody, MD

## 2023-02-27 NOTE — Patient Instructions (Addendum)
Treatment Plan: Start clobetasol 0.05% ointment twice daily until smooth. Recommend wearing cotton gloves after applying ointment at bedtime. Avoid applying to face, groin, and axilla. Use as directed. Long-term use can cause thinning of the skin.  Topical steroids (such as triamcinolone, fluocinolone, fluocinonide, mometasone, clobetasol, halobetasol, betamethasone, hydrocortisone) can cause thinning and lightening of the skin if they are used for too long in the same area. Your physician has selected the right strength medicine for your problem and area affected on the body. Please use your medication only as directed by your physician to prevent side effects.    Due to recent changes in healthcare laws, you may see results of your pathology and/or laboratory studies on MyChart before the doctors have had a chance to review them. We understand that in some cases there may be results that are confusing or concerning to you. Please understand that not all results are received at the same time and often the doctors may need to interpret multiple results in order to provide you with the best plan of care or course of treatment. Therefore, we ask that you please give [place] 2 business days to thoroughly review all your results before contacting the office for clarification. Should we see a critical lab result, you will be contacted sooner.   If You Need Anything After Your Visit  If you have any questions or concerns for your doctor, please call our main line at [phone] and press option 4 to reach your doctor's medical assistant. If no one answers, please leave a voicemail as directed and we will return your call as soon as possible. Messages left after 4 pm will be answered the following business day.   You may also send [place] a message via MyChart. We typically respond to MyChart messages within 1-2 business days.  For prescription refills, please ask your pharmacy to contact our office. Our fax number  is [phone].  If you have an urgent issue when the clinic is closed that cannot wait until the next business day, you can page your doctor at the number below.    Please note that while we do our best to be available for urgent issues outside of office hours, we are not available 24/7.   If you have an urgent issue and are unable to reach [place], you may choose to seek medical care at your doctor's office, retail clinic, urgent care center, or emergency room.  If you have a medical emergency, please immediately call 911 or go to the emergency department.  Pager Numbers  - Dr. Gwen Pounds: [phone]  - Dr. Roseanne Reno: [phone]  - Dr. Katrinka Blazing: [phone]   In the event of inclement weather, please call our main line at [phone] for an update on the status of any delays or closures.  Dermatology Medication Tips: Please keep the boxes that topical medications come in in order to help keep track of the instructions about where and how to use these. Pharmacies typically print the medication instructions only on the boxes and not directly on the medication tubes.   If your medication is too expensive, please contact our office at [phone] option 4 or send [place] a message through MyChart.   We are unable to tell what your co-pay for medications will be in advance as this is different depending on your insurance coverage. However, we may be able to find a substitute medication at lower cost or fill out paperwork to get insurance to cover a needed medication.   If  a prior authorization is required to get your medication covered by your insurance company, please allow [place] 1-2 business days to complete this process.  Drug prices often vary depending on where the prescription is filled and some pharmacies may offer cheaper prices.  The website www.goodrx.com contains coupons for medications through different pharmacies. The prices here do not account for what the cost may be with help from  insurance (it may be cheaper with your insurance), but the website can give you the price if you did not use any insurance.  - You can print the associated coupon and take it with your prescription to the pharmacy.  - You may also stop by our office during regular business hours and pick up a GoodRx coupon card.  - If you need your prescription sent electronically to a different pharmacy, notify our office through [facility] or by phone at [phone] option 4.     Si Usted Necesita Algo Despus de Su Visita  Tambin puede enviarnos un mensaje a travs de Clinical cytogeneticist. Por lo general respondemos a los mensajes de MyChart en el transcurso de 1 a 2 das hbiles.  Para renovar recetas, por favor pida a su farmacia que se ponga en contacto con nuestra oficina. Annie Sable de fax es [place] [address] [place] [phone].  Si tiene un asunto urgente cuando la clnica est cerrada y que no puede esperar hasta el siguiente da hbil, puede llamar/localizar a su doctor(a) al nmero que aparece a continuacin.   Por favor, tenga en cuenta que aunque hacemos todo lo posible para estar disponibles para asuntos urgentes fuera del horario de [place], no estamos disponibles las 24 horas del da, los 7 [address]  [address] de la [place].   Si tiene un problema urgente y no puede comunicarse con nosotros, puede optar por buscar atencin mdica  en el consultorio de su doctor(a), en una clnica privada, en un centro de atencin urgente o en una sala de emergencias.  Si tiene Engineer, drilling, por favor llame inmediatamente al 911 o vaya a la sala de emergencias.  Nmeros de bper  - Dr. Gwen Pounds: [phone]  - Dra. Roseanne Reno: [phone]  - Dr. Katrinka Blazing: [phone]   En caso de inclemencias del tiempo, por favor llame a Lacy Duverney principal al [phone] para una actualizacin sobre el [place] de cualquier retraso o cierre.  Consejos para la medicacin en dermatologa: Por favor, guarde las cajas en las que vienen los  medicamentos de uso tpico para ayudarle a seguir las instrucciones sobre dnde y cmo usarlos. Las farmacias generalmente imprimen las instrucciones del medicamento slo en las cajas y no directamente en los tubos del [place].   Si su medicamento es muy caro, por favor, pngase en contacto con Rolm Gala llamando al [phone] y presione la opcin 4 o envenos un mensaje a travs de Clinical cytogeneticist.   No podemos decirle cul ser su copago por los medicamentos por adelantado ya que esto es diferente dependiendo de la cobertura de su seguro. Sin embargo, es posible que podamos encontrar un medicamento sustituto a Audiological scientist un formulario para que el seguro cubra el medicamento que se considera necesario.   Si se requiere una autorizacin previa para que su compaa de seguros [place] su medicamento, por favor permtanos de 1 a 2 das hbiles para completar [address]  Los precios de los medicamentos varan con frecuencia dependiendo del Environmental consultant de dnde se surte la receta y alguna farmacias pueden ofrecer precios ms baratos.  El sitio web www.goodrx.com  tiene cupones para medicamentos de Health and safety inspector. Los precios aqu no tienen en cuenta lo que podra costar con la ayuda del seguro (puede ser ms barato con su seguro), pero el sitio web puede darle el precio si no utiliz Tourist information centre manager.  - Puede imprimir el cupn correspondiente y llevarlo con su receta a la farmacia.  - Tambin puede pasar por nuestra oficina durante el horario de atencin regular y Education officer, museum una tarjeta de cupones de GoodRx.  - Si necesita que su receta se enve electrnicamente a una farmacia diferente, informe a nuestra oficina a travs de MyChart de [place] o por telfono llamando al [phone] y presione la opcin 4.

## 2023-03-13 NOTE — Progress Notes (Deleted)
Established Patient Office Visit  Subjective  Patient ID: Jerome Noble, male    DOB: [birth year]  Age: 47 y.o. MRN: [id number]  No chief complaint on file.   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 Social History   Tobacco Use   Smoking status: Never   Smokeless tobacco: Current    Types: Chew  Vaping Use   Vaping status: Never Used  Substance Use Topics   Alcohol use: Yes    Alcohol/week: 20.0 standard drinks of alcohol    Types: 20 Cans of beer per week   Drug use: No   Social History   Socioeconomic History   Marital status: Married    Spouse name: Not on file   Number of children: Not on file   Years of education: Not on file   Highest education level: Not on file  Occupational History   Not on file  Tobacco Use   Smoking status: Never   Smokeless tobacco: Current    Types: Chew  Vaping Use   Vaping status: Never Used  Substance and Sexual Activity   Alcohol use: Yes    Alcohol/week: 20.0 standard drinks of alcohol    Types: 20 Cans of beer per week   Drug use: No   Sexual activity: Not on file  Other Topics Concern   Not on file  Social History Narrative   Not on file   Social Drivers of Health   Financial Resource Strain: Low Risk  (11/27/2022)   Overall Financial Resource Strain (CARDIA)    Difficulty of Paying Living Expenses: Not hard at all  Food Insecurity: No Food Insecurity (11/27/2022)   Hunger Vital Sign    Worried About Running Out of Food in the Last Year: Never true    Ran Out of Food in the Last Year: Never true  Transportation Needs: Unknown (11/27/2022)   PRAPARE - Administrator, Civil Service (Medical): Not on file    Lack of Transportation (Non-Medical): No  Physical Activity: Not on file  Stress: Not on file  Social Connections: Unknown (08/06/2021)    [facility]   Social Network    Social Network: Not on file  Intimate Partner Violence: Not At Risk (11/27/2022)   Humiliation, Afraid, Rape, and Kick questionnaire    Fear of Current or Ex-Partner: No    Emotionally Abused: No    Physically Abused: No    Sexually Abused: No   Family Status  Relation Name Status   Mother  Alive   Father  Alive   Brother  Alive   Brother  Alive  No partnership data on file   Family History  Problem Relation Age of Onset   Heart disease Mother    Hyperlipidemia Mother    Allergies  Allergen Reactions   Azelastine Hcl Hives      ROS Negative unless indicated in HPI   Objective:     There were no vitals taken for this visit. BP Readings from Last 3 Encounters:  01/14/23 130/79  11/27/22 125/81  03/26/21 (!) 146/86   Wt Readings from Last 3 Encounters:  01/14/23 221 lb 9.6 oz (100.5 kg)  11/27/22 224 lb 12.8 oz (102 kg)  09/09/17 221 lb (100.2 kg)      Physical Exam  No results found for any visits on 03/18/23.  Last CBC Lab Results  Component Value Date   WBC 3.9 11/27/2022   HGB 14.3 11/27/2022   HCT 41.2 11/27/2022   MCV 85 11/27/2022   MCH 29.5 11/27/2022   RDW 12.3 11/27/2022   PLT 273 11/27/2022   Last metabolic panel Lab Results  Component Value Date   GLUCOSE 101 (H) 11/27/2022   NA 140 11/27/2022   K 4.4 11/27/2022   CL 103 11/27/2022   CO2 24 11/27/2022   BUN 17 11/27/2022   CREATININE 1.16 11/27/2022   EGFR 78 11/27/2022   CALCIUM 9.3 11/27/2022   PROT 6.4 11/27/2022   ALBUMIN 4.4 11/27/2022   LABGLOB 2.0 11/27/2022   BILITOT 0.3 11/27/2022   ALKPHOS 62 11/27/2022   AST 19 11/27/2022   ALT 18 11/27/2022   Last lipids Lab Results  Component Value Date   CHOL 193 11/27/2022   HDL 62 11/27/2022   LDLCALC 108 (H) 11/27/2022   TRIG 134 11/27/2022   CHOLHDL 3.1 11/27/2022   Last thyroid functions Lab Results  Component Value Date   TSH 1.920 11/27/2022   T4TOTAL 4.4 (L)  11/27/2022        Assessment & Plan:  There are no diagnoses linked to this encounter. Continue healthy lifestyle choices, including diet (rich in fruits, vegetables, and lean proteins, and low in salt and simple carbohydrates) and exercise (at least 30 minutes of moderate physical activity daily).     The above assessment and management plan was discussed with the patient. The patient verbalized understanding of and has agreed to the management plan. Patient is aware to call the clinic if they develop any new symptoms or if symptoms persist or worsen. Patient is aware when to return to the clinic for a follow-up visit. Patient educated on when it is appropriate to go to the emergency department.  No follow-ups on file.   Arrie Aran Santa Lighter, [facility] [address] [place], [place] [address] [phone]    Note: This document was prepared by Reubin Milan voice dictation technology and any errors that results from this process are unintentional.

## 2023-03-15 LAB — COLOGUARD: COLOGUARD: NEGATIVE

## 2023-03-18 ENCOUNTER — Ambulatory Visit: Payer: Self-pay | Admitting: Nurse Practitioner

## 2023-03-28 ENCOUNTER — Ambulatory Visit: Payer: Commercial Managed Care - HMO | Admitting: Dermatology

## 2023-03-28 ENCOUNTER — Encounter: Payer: Self-pay | Admitting: Dermatology

## 2023-03-28 DIAGNOSIS — R21 Rash and other nonspecific skin eruption: Secondary | ICD-10-CM | POA: Diagnosis not present

## 2023-03-28 DIAGNOSIS — Z79899 Other long term (current) drug therapy: Secondary | ICD-10-CM | POA: Diagnosis not present

## 2023-03-28 DIAGNOSIS — L309 Dermatitis, unspecified: Secondary | ICD-10-CM

## 2023-03-28 MED ORDER — DUPIXENT 300 MG/2ML ~~LOC~~ SOAJ: 300.0000 mg | SUBCUTANEOUS | 5 refills | Status: DC

## 2023-03-28 MED ORDER — DUPILUMAB 300 MG/2ML ~~LOC~~ SOSY
600.0000 mg | PREFILLED_SYRINGE | Freq: Once | SUBCUTANEOUS | Status: AC
  Administered 2023-03-28: 600 mg via SUBCUTANEOUS

## 2023-03-28 NOTE — Patient Instructions (Signed)
Dupilumab (Dupixent) is a treatment given by injection for adults and children with moderate-to-severe atopic dermatitis. Goal is control of skin condition, not cure. It is given as 2 injections at the first dose followed by 1 injection ever 2 weeks thereafter.  Young children are dosed monthly.  Potential side effects include allergic reaction, herpes infections, injection site reactions and conjunctivitis (inflammation of the eyes).  The use of Dupixent requires long term medication management, including periodic office visits.  Due to recent changes in healthcare laws, you may see results of your pathology and/or laboratory studies on MyChart before the doctors have had a chance to review them. We understand that in some cases there may be results that are confusing or concerning to you. Please understand that not all results are received at the same time and often the doctors may need to interpret multiple results in order to provide you with the best plan of care or course of treatment. Therefore, we ask that you please give [place] 2 business days to thoroughly review all your results before contacting the office for clarification. Should we see a critical lab result, you will be contacted sooner.   If You Need Anything After Your Visit  If you have any questions or concerns for your doctor, please call our main line at [phone] and press option 4 to reach your doctor's medical assistant. If no one answers, please leave a voicemail as directed and we will return your call as soon as possible. Messages left after 4 pm will be answered the following business day.   You may also send [place] a message via MyChart. We typically respond to MyChart messages within 1-2 business days.  For prescription refills, please ask your pharmacy to contact our office. Our fax number is [phone].  If you have an urgent issue when the clinic is closed that cannot wait until the next business day, you can page your  doctor at the number below.    Please note that while we do our best to be available for urgent issues outside of office hours, we are not available 24/7.   If you have an urgent issue and are unable to reach [place], you may choose to seek medical care at your doctor's office, retail clinic, urgent care center, or emergency room.  If you have a medical emergency, please immediately call 911 or go to the emergency department.  Pager Numbers  - Dr. Gwen Pounds: [phone]  - Dr. Roseanne Reno: [phone]  - Dr. Katrinka Blazing: [phone]   In the event of inclement weather, please call our main line at [phone] for an update on the status of any delays or closures.  Dermatology Medication Tips: Please keep the boxes that topical medications come in in order to help keep track of the instructions about where and how to use these. Pharmacies typically print the medication instructions only on the boxes and not directly on the medication tubes.   If your medication is too expensive, please contact our office at [phone] option 4 or send [place] a message through MyChart.   We are unable to tell what your co-pay for medications will be in advance as this is different depending on your insurance coverage. However, we may be able to find a substitute medication at lower cost or fill out paperwork to get insurance to cover a needed medication.   If a prior authorization is required to get your medication covered by your insurance company, please allow [place] 1-2 business days to complete  this process.  Drug prices often vary depending on where the prescription is filled and some pharmacies may offer cheaper prices.  The website www.goodrx.com contains coupons for medications through different pharmacies. The prices here do not account for what the cost may be with help from insurance (it may be cheaper with your insurance), but the website can give you the price if you did not use any insurance.  - You can print  the associated coupon and take it with your prescription to the pharmacy.  - You may also stop by our office during regular business hours and pick up a GoodRx coupon card.  - If you need your prescription sent electronically to a different pharmacy, notify our office through [facility] or by phone at [phone] option 4.     Si Usted Necesita Algo Despus de Su Visita  Tambin puede enviarnos un mensaje a travs de Clinical cytogeneticist. Por lo general respondemos a los mensajes de MyChart en el transcurso de 1 a 2 das hbiles.  Para renovar recetas, por favor pida a su farmacia que se ponga en contacto con nuestra oficina. Annie Sable de fax es [place] [phone].  Si tiene un asunto urgente cuando la clnica est cerrada y que no puede esperar hasta el siguiente da hbil, puede llamar/localizar a su doctor(a) al nmero que aparece a continuacin.   Por favor, tenga en cuenta que aunque hacemos todo lo posible para estar disponibles para asuntos urgentes fuera del horario de [place], no estamos disponibles las 24 horas del da, los 7 [address]  [address] de la [place].   Si tiene un problema urgente y no puede comunicarse con nosotros, puede optar por buscar atencin mdica  en el consultorio de su doctor(a), en una clnica privada, en un centro de atencin urgente o en una sala de emergencias.  Si tiene Engineer, drilling, por favor llame inmediatamente al 911 o vaya a la sala de emergencias.  Nmeros de bper  - Dr. Gwen Pounds: [phone]  - Dra. Roseanne Reno: [phone]  - Dr. Katrinka Blazing: [phone]   En caso de inclemencias del tiempo, por favor llame a Lacy Duverney principal al [phone] para una actualizacin sobre el [place] de cualquier retraso o cierre.  Consejos para la medicacin en dermatologa: Por favor, guarde las cajas en las que vienen los medicamentos de uso tpico para ayudarle a seguir las instrucciones sobre dnde y cmo usarlos. Las farmacias generalmente imprimen las  instrucciones del medicamento slo en las cajas y no directamente en los tubos del [place].   Si su medicamento es muy caro, por favor, pngase en contacto con Rolm Gala llamando al [phone] y presione la opcin 4 o envenos un mensaje a travs de Clinical cytogeneticist.   No podemos decirle cul ser su copago por los medicamentos por adelantado ya que esto es diferente dependiendo de la cobertura de su seguro. Sin embargo, es posible que podamos encontrar un medicamento sustituto a Audiological scientist un formulario para que el seguro cubra el medicamento que se considera necesario.   Si se requiere una autorizacin previa para que su compaa de seguros [place] su medicamento, por favor permtanos de 1 a 2 das hbiles para completar [address]  Los precios de los medicamentos varan con frecuencia dependiendo del Environmental consultant de dnde se surte la receta y alguna farmacias pueden ofrecer precios ms baratos.  El sitio web www.goodrx.com tiene cupones para medicamentos de Health and safety inspector. Los precios aqu no tienen en cuenta lo que podra costar con la ayuda del  seguro (puede ser ms barato con su seguro), pero el sitio web puede darle el precio si no Visual merchandiser.  - Puede imprimir el cupn correspondiente y llevarlo con su receta a la farmacia.  - Tambin puede pasar por nuestra oficina durante el horario de atencin regular y Education officer, museum una tarjeta de cupones de GoodRx.  - Si necesita que su receta se enve electrnicamente a una farmacia diferente, informe a nuestra oficina a travs de MyChart de [place] o por telfono llamando al [phone] y presione la opcin 4.

## 2023-03-28 NOTE — Progress Notes (Signed)
   Follow-Up Visit   Subjective  Jerome Noble is a 48 y.o. male who presents for the following: 1 month follow up for hand dermatitis and check tops of ears. Patient currently using clobetasol  0.05% ointment 2-3 times daily and covering with cotton gloves at bedtime. Patient has tried and failed tacrolimus , prescribed by PCP.   The patient has spots, moles and lesions to be evaluated, some may be new or changing and the patient may have concern these could be cancer.   The following portions of the chart were reviewed this encounter and updated as appropriate: medications, allergies, medical history  Review of Systems:  No other skin or systemic complaints except as noted in HPI or Assessment and Plan.  Objective  Well appearing patient in no apparent distress; mood and affect are within normal limits.   A focused examination was performed of the following areas: hands  Relevant exam findings are noted in the Assessment and Plan.    Assessment & Plan   Rash, flaring, potential for chronicity, failed tacrolimus  and clobetasol , not at patient goal, harming quality of life Exam: xerotic scaly plaques with severe fissure of palmar fingers on right hand. 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  Samples of Dupixent  600 mg/4 mL injected today to B/L upper arms. Patient tolerated well. NDC 9975-4085-97 Lot # [id number]  Exp: 10/2024  Sample of Dupixent  300 mg/2 mL auto injector given to patient to self inject in 2 weeks. Reviewed administration with patient and he confirms that he is comfortable to self inject. Lot # [id number]  Exp: 08/23/2024  Dupilumab  (Dupixent ) is a treatment given by injection for adults and  children with moderate-to-severe atopic dermatitis. Goal is control of skin condition, not cure. It is given as 2 injections at the first dose followed by 1 injection ever 2 weeks thereafter.  Young children are dosed monthly.  Potential side effects include allergic reaction, herpes infections, injection site reactions and conjunctivitis (inflammation of the eyes).  The use of Dupixent  requires long term medication management, including periodic office visits.    HAND DERMATITIS   Related Medications dupilumab  (DUPIXENT ) prefilled syringe 600 mg  LONG-TERM USE OF HIGH-RISK MEDICATION    Return in about 4 weeks (around 04/25/2023) for Eczema, with Dr. Claudene.  Jerome Noble, RMA, am acting as scribe for Boneta Claudene, MD .   Documentation: I have reviewed the above documentation for accuracy and completeness, and I agree with the above.  Boneta Claudene, MD

## 2023-03-28 NOTE — Progress Notes (Addendum)
 Established Patient Office Visit  Subjective  Patient ID: Jerome Noble, male    DOB: [birth year]  Age: 48 y.o. MRN: [id number]  Chief Complaint  Patient presents with   Medical Management of Chronic Issues    Follow up    HPI Jerome Noble is a 48 year old male present April 03, 2022 for 1 month follow-up for cirrhosis and abnormal thyroid  test Xerosis cutis: At his last last appointment he was referred to dermatology and reports that he had to appointment with them today the first appointment he was prescribed a stronger topical steroids cream which she has used with minimal relief.  He was then started on Dupixent  shot at the second appointment already had 2 shots seen for have to come back in a month for third one and I do see some improvement.  Denied any side effect from the injection, no shortness of breath no chest pain Abnormal thyroid  function test: presenting for follow-up regarding abnormal thyroid  test results obtained 4 months ago. The patient reports no symptoms such as weight changes, fatigue, heat/cold intolerance, palpitations, or changes in skin/hair/nail appearance. The patient denies any personal history of thyroid  disease or known family history of thyroid  disorders. The thyroid  test was performed 4 months ago but has not been rechecked until today. The patient is here today for reevaluation and to check thyroid  function again.  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 No pertinent surgical history. Social History   Tobacco Use   Smoking status: Never   Smokeless tobacco: Current    Types: Chew  Vaping Use   Vaping status: Never Used  Substance Use Topics   Alcohol use: Yes    Alcohol/week: 20.0 standard drinks of alcohol    Types: 20 Cans of beer per week   Drug use: No    Social History   Socioeconomic History   Marital status: Married    Spouse name: Not on file   Number of children: Not on file   Years of education: Not on file   Highest education level: Not on file  Occupational History   Not on file  Tobacco Use   Smoking status: Never   Smokeless tobacco: Current    Types: Chew  Vaping Use   Vaping status: Never Used  Substance and Sexual Activity   Alcohol use: Yes    Alcohol/week: 20.0 standard drinks of alcohol    Types: 20 Cans of beer per week   Drug use: No   Sexual activity: Not on file  Other Topics Concern   Not on file  Social History Narrative   Not on file   Social Drivers of Health   Financial Resource Strain: Low Risk  (11/27/2022)   Overall Financial Resource Strain (CARDIA)    Difficulty of Paying Living Expenses: Not hard at all  Food Insecurity: No Food Insecurity (11/27/2022)   Hunger Vital Sign    Worried About Running Out of Food in the Last Year: Never true    Ran Out of Food in the Last Year: Never true  Transportation Needs: Unknown (11/27/2022)   PRAPARE - Administrator, Civil Service (Medical): Not on file    Lack of Transportation (Non-Medical): No  Physical Activity: Not on file  Stress: Not on file  Social Connections:  Unknown (08/06/2021)   [facility]   Social Network    Social Network: Not on file  Intimate Partner Violence: Not At Risk (11/27/2022)   Humiliation, Afraid, Rape, and Kick questionnaire    Fear of Current or Ex-Partner: No    Emotionally Abused: No    Physically Abused: No    Sexually Abused: No   Family Status  Relation Name Status   Mother  Alive   Father  Alive   Brother  Alive   Brother  Alive  No partnership data on file   Family History  Problem Relation Age of Onset   Heart disease Mother    Hyperlipidemia Mother    Allergies  Allergen Reactions   Azelastine Hcl Hives      Review of Systems  Constitutional:  Negative for chills and  fever.  Respiratory:  Negative for shortness of breath and wheezing.   Cardiovascular:  Negative for chest pain and leg swelling.  Skin:  Positive for rash. Negative for itching.       Right hand/fingers  Neurological:  Negative for dizziness and headaches.  Psychiatric/Behavioral:  Negative for suicidal ideas. The patient does not have insomnia.    Negative unless indicated in HPI   Objective:     BP 124/76   Pulse 74   Temp 97.8 F (36.6 C) (Temporal)   Ht 6' 2 (1.88 m)   Wt 225 lb 6.4 oz (102.2 kg)   SpO2 99%   BMI 28.94 kg/m  BP Readings from Last 3 Encounters:  04/04/23 124/76  01/14/23 130/79  11/27/22 125/81   Wt Readings from Last 3 Encounters:  04/04/23 225 lb 6.4 oz (102.2 kg)  01/14/23 221 lb 9.6 oz (100.5 kg)  11/27/22 224 lb 12.8 oz (102 kg)      Physical Exam Vitals and nursing note reviewed.  Constitutional:      Appearance: He is overweight.  HENT:     Head: Normocephalic and atraumatic.     Nose: Nose normal.     Mouth/Throat:     Mouth: Mucous membranes are moist.  Eyes:     General: No scleral icterus.    Extraocular Movements: Extraocular movements intact.     Conjunctiva/sclera: Conjunctivae normal.     Pupils: Pupils are equal, round, and reactive to light.  Cardiovascular:     Rate and Rhythm: Normal rate and regular rhythm.  Pulmonary:     Effort: Pulmonary effort is normal.     Breath sounds: Normal breath sounds.  Musculoskeletal:     Right lower leg: No edema.     Left lower leg: No edema.  Skin:    Findings: Rash present. Rash is purpuric.     Comments: Right hand  Neurological:     Mental Status: He is alert and oriented to person, place, and time. Mental status is at baseline.  Psychiatric:        Mood and Affect: Mood normal.        Behavior: Behavior normal.        Thought Content: Thought content normal.        Judgment: Judgment normal.      Results for orders placed or performed in visit on 04/04/23  Thyroid   Panel With TSH  Result Value Ref Range   TSH 1.510 0.450 - 4.500 uIU/mL   T4, Total 6.4 4.5 - 12.0 ug/dL   T3 Uptake Ratio 30 24 - 39 %   Free Thyroxine Index 1.9 1.2 -  4.9    Last CBC Lab Results  Component Value Date   WBC 3.9 11/27/2022   HGB 14.3 11/27/2022   HCT 41.2 11/27/2022   MCV 85 11/27/2022   MCH 29.5 11/27/2022   RDW 12.3 11/27/2022   PLT 273 11/27/2022   Last metabolic panel Lab Results  Component Value Date   GLUCOSE 101 (H) 11/27/2022   NA 140 11/27/2022   K 4.4 11/27/2022   CL 103 11/27/2022   CO2 24 11/27/2022   BUN 17 11/27/2022   CREATININE 1.16 11/27/2022   EGFR 78 11/27/2022   CALCIUM 9.3 11/27/2022   PROT 6.4 11/27/2022   ALBUMIN 4.4 11/27/2022   LABGLOB 2.0 11/27/2022   BILITOT 0.3 11/27/2022   ALKPHOS 62 11/27/2022   AST 19 11/27/2022   ALT 18 11/27/2022   Last lipids Lab Results  Component Value Date   CHOL 193 11/27/2022   HDL 62 11/27/2022   LDLCALC 108 (H) 11/27/2022   TRIG 134 11/27/2022   CHOLHDL 3.1 11/27/2022   Last hemoglobin A1c No results found for: HGBA1C Last thyroid  functions Lab Results  Component Value Date   TSH 1.510 04/04/2023   T4TOTAL 6.4 04/04/2023        Assessment & Plan:  Xerosis cutis  Abnormal thyroid  function test -     Thyroid  Panel With TSH  Jerome Noble is a 48 year old Caucasian male seen today for xerosis cutis, no acute distress Xerosis cutis: Continue follow-up with dermatology for Dupixent  injection Abnormal thyroid  function tests: Will repeat thyroid  test result pending  Encourage  healthy lifestyle choices, including diet (rich in fruits, vegetables, and lean proteins, and low in salt and simple carbohydrates) and exercise (at least 30 minutes of moderate physical activity daily).     The above assessment and management plan was discussed with the patient. The patient verbalized understanding of and has agreed to the management plan. Patient is aware to call the clinic if they develop any  new symptoms or if symptoms persist or worsen. Patient is aware when to return to the clinic for a follow-up visit. Patient educated on when it is appropriate to go to the emergency department.  Return in about 1 year (around 04/03/2024) for physical.    Nena Deitra Morton Sebastian, DNP [facility] [address] [place], [place] [address] [phone]    Note: This document was prepared by Dragon voice dictation technology and any errors that results from this process are unintentional.

## 2023-04-01 ENCOUNTER — Encounter: Payer: Self-pay | Admitting: Dermatology

## 2023-04-04 ENCOUNTER — Ambulatory Visit (INDEPENDENT_AMBULATORY_CARE_PROVIDER_SITE_OTHER): Payer: Commercial Managed Care - HMO | Admitting: Nurse Practitioner

## 2023-04-04 ENCOUNTER — Encounter: Payer: Self-pay | Admitting: Nurse Practitioner

## 2023-04-04 VITALS — BP 124/76 | HR 74 | Temp 97.8°F | Ht 74.0 in | Wt 225.4 lb

## 2023-04-04 DIAGNOSIS — L853 Xerosis cutis: Secondary | ICD-10-CM | POA: Diagnosis not present

## 2023-04-04 DIAGNOSIS — R946 Abnormal results of thyroid function studies: Secondary | ICD-10-CM

## 2023-04-05 LAB — THYROID PANEL WITH TSH
Free Thyroxine Index: 1.9 (ref 1.2–4.9)
T3 Uptake Ratio: 30 % (ref 24–39)
T4, Total: 6.4 ug/dL (ref 4.5–12.0)
TSH: 1.51 u[IU]/mL (ref 0.450–4.500)

## 2023-04-25 ENCOUNTER — Ambulatory Visit: Payer: Commercial Managed Care - HMO | Admitting: Dermatology

## 2023-04-25 ENCOUNTER — Encounter: Payer: Self-pay | Admitting: Dermatology

## 2023-04-25 DIAGNOSIS — Z79899 Other long term (current) drug therapy: Secondary | ICD-10-CM | POA: Diagnosis not present

## 2023-04-25 DIAGNOSIS — L570 Actinic keratosis: Secondary | ICD-10-CM | POA: Diagnosis not present

## 2023-04-25 DIAGNOSIS — W908XXA Exposure to other nonionizing radiation, initial encounter: Secondary | ICD-10-CM

## 2023-04-25 DIAGNOSIS — L309 Dermatitis, unspecified: Secondary | ICD-10-CM

## 2023-04-25 NOTE — Patient Instructions (Signed)

## 2023-04-25 NOTE — Progress Notes (Addendum)
Follow-Up Visit   Subjective  Jerome Noble is a 48 y.o. male who presents for the following: 4 week follow up for hand dermatitis. Pt currently on dupxient, pt's last dose was x2 weeks ago, pt has been self injecting at home. Pt requesting samples for dupixent, has not been receiving them due to issue with pharmacy. Pt has been using clobetasol ointment 0.05% prn reports he has used it x couple nights ago.  Pt also has spot on L ear and nose he would like looked at.   The patient has spots, moles and lesions to be evaluated, some may be new or changing and the patient may have concern these could be cancer.   The following portions of the chart were reviewed this encounter and updated as appropriate: medications, allergies, medical history  Review of Systems:  No other skin or systemic complaints except as noted in HPI or Assessment and Plan.  Objective  Well appearing patient in no apparent distress; mood and affect are within normal limits.   A focused examination was performed of the following areas: Hands, L ear, nose  Relevant exam findings are noted in the Assessment and Plan.    Assessment & Plan    HAND DERMATITIS, 2% BSA, affecting hands and severely harming quality of life and ability to work, failed tacrolimus and clobetasol   Exam xerotic fissured plaques of palmar fingers of R hand. Xerosis on L palmar hand  Chronic and persistent condition with duration or expected duration over one year. Condition is symptomatic / bothersome to patient. Currently flared.   Hand Dermatitis is a chronic type of eczema that can come and go on the hands and fingers.  While there is no cure, the rash and symptoms can be managed with topical prescription medications, and for more severe cases, with systemic medications.  Recommend mild soap and routine use of moisturizing cream after handwashing.  Minimize soap/water exposure when possible.    Treatment Plan Continue Dupixent 300mg /10ml,  pt denies any side effects on dupixent. Pt is okay with self-injecting at home.  Patient provided with samples for 1 month  NDC:0024-5915-20 LOT: [id number] Exp: 11-23-2024  Dupilumab (Dupixent) is a treatment given by injection for adults and children with moderate-to-severe atopic dermatitis. Goal is control of skin condition, not cure. It is given as 2 injections at the first dose followed by 1 injection ever 2 weeks thereafter.  Young children are dosed monthly.  Potential side effects include allergic reaction, herpes infections, injection site reactions and conjunctivitis (inflammation of the eyes).  The use of Dupixent requires long term medication management, including periodic office visits.    Reviewed risks of biologics including immunosuppression, infections, injection site reaction, and failure to improve condition. Goal is control of skin condition, not cure.  Some older biologics such as Humira and Enbrel may slightly increase risk of malignancy and may worsen congestive heart failure.  Taltz and Cosentyx may cause inflammatory bowel disease to flare. The use of biologics requires long term medication management, including periodic office visits and monitoring of blood work.   ACTINIC KERATOSIS Exam: Erythematous thin papules/macules with gritty scale at the L superior helix, right nasal ala   Actinic keratoses are precancerous spots that appear secondary to cumulative UV radiation exposure/sun exposure over time. They are chronic with expected duration over 1 year. A portion of actinic keratoses will progress to squamous cell carcinoma of the skin. It is not possible to reliably predict which spots will progress to skin  cancer and so treatment is recommended to prevent development of skin cancer.  Recommend daily broad spectrum sunscreen SPF 30+ to sun-exposed areas, reapply every 2 hours as needed.  Recommend staying in the shade or wearing long sleeves, sun glasses (UVA+UVB  protection) and wide brim hats (4-inch brim around the entire circumference of the hat). Call for new or changing lesions.  Treatment Plan: Discussed treatment with LN2 with patient, patient prefers to monitor for now.     Return in about 2 months (around 06/23/2023) for w/ Dr. Katrinka Blazing follow up for hand dermatitis .  Wynonia Lawman, CMA, am acting as scribe for Elie Goody, MD .   Documentation: I have reviewed the above documentation for accuracy and completeness, and I agree with the above.  Elie Goody, MD

## 2023-05-09 ENCOUNTER — Telehealth: Payer: Self-pay

## 2023-05-09 NOTE — Telephone Encounter (Signed)
Michelene Heady is requesting BSA% for Dupixent RX.

## 2023-05-22 ENCOUNTER — Telehealth: Payer: Self-pay

## 2023-05-22 NOTE — Telephone Encounter (Signed)
 Per Dr. Katrinka Blazing offer another box of Dupixent samples to help keep patient covered while fighting with insurance.  Patient will come pick up tomorrow. aw

## 2023-05-23 ENCOUNTER — Telehealth: Payer: Self-pay

## 2023-05-23 NOTE — Telephone Encounter (Signed)
 1 sample of Dupixent Pen given to patient.  Lot # [id number]  11/23/2024

## 2023-06-24 ENCOUNTER — Encounter: Payer: Self-pay | Admitting: Dermatology

## 2023-06-24 ENCOUNTER — Ambulatory Visit: Payer: Commercial Managed Care - HMO | Admitting: Dermatology

## 2023-06-24 DIAGNOSIS — Z79899 Other long term (current) drug therapy: Secondary | ICD-10-CM

## 2023-06-24 DIAGNOSIS — Z7189 Other specified counseling: Secondary | ICD-10-CM | POA: Diagnosis not present

## 2023-06-24 DIAGNOSIS — L309 Dermatitis, unspecified: Secondary | ICD-10-CM | POA: Diagnosis not present

## 2023-06-24 DIAGNOSIS — L2089 Other atopic dermatitis: Secondary | ICD-10-CM | POA: Diagnosis not present

## 2023-06-24 NOTE — Patient Instructions (Addendum)
 Plan to start Cibinqo 100 mg daily pending normal lab results.  Samples given today. Do not start until we call you regarding lab results.     Discussed potential risks/benefits of JAK inhibitors (Rinvoq/Cibinqo) which are new oral biologic medications used for treating severe, refractory atopic dermatitis.  They are very effective in resolving itchy eczema rashes of the skin.  Overall potential side effects are very minimal and mild and they are much safer than oral steroids. The black box warning for the JAK inhibitor class was discussed, including rare cardiovascular effects, thrombosis, and serious bacterial and viral infections, such as TB or herpes zoster, although the severe CV side effects have not been seen with Rinvoq or Cibinqo in their safety trials.  Shingles vaccination may be recommended prior to starting medication.  Baseline lab tests are done prior to initiation of medication to ensure pt has no underlying lab abnormalities or infection, and then rechecked at 3 months.  If normal, then periodic doctor visits and annual labs are performed for long term medication management.   Gentle Skin Care Guide  1. Bathe no more than once a day.  2. Avoid bathing in hot water  3. Use a mild soap like Dove, Vanicream, Cetaphil, CeraVe. Can use Lever 2000 or Cetaphil antibacterial soap  4. Use soap only where you need it. On most days, use it under your arms, between your legs, and on your feet. Let the water rinse other areas unless visibly dirty.  5. When you get out of the bath/shower, use a towel to gently blot your skin dry, don't rub it.  6. While your skin is still a little damp, apply a moisturizing cream such as Vanicream, CeraVe, Cetaphil, Eucerin, Sarna lotion or plain Vaseline Jelly. For hands apply Neutrogena [place] Hand Cream or Excipial Hand Cream.  7. Reapply moisturizer any time you start to itch or feel dry.  8. Sometimes using free and clear laundry detergents can be  helpful. Fabric softener sheets should be avoided. Downy Free & Gentle liquid, or any liquid fabric softener that is free of dyes and perfumes, it acceptable to use  9. If your doctor has given you prescription creams you may apply moisturizers over them       Due to recent changes in healthcare laws, you may see results of your pathology and/or laboratory studies on MyChart before the doctors have had a chance to review them. We understand that in some cases there may be results that are confusing or concerning to you. Please understand that not all results are received at the same time and often the doctors may need to interpret multiple results in order to provide you with the best plan of care or course of treatment. Therefore, we ask that you please give [place] 2 business days to thoroughly review all your results before contacting the office for clarification. Should we see a critical lab result, you will be contacted sooner.   If You Need Anything After Your Visit  If you have any questions or concerns for your doctor, please call our main line at [phone] and press option 4 to reach your doctor's medical assistant. If no one answers, please leave a voicemail as directed and we will return your call as soon as possible. Messages left after 4 pm will be answered the following business day.   You may also send [place] a message via MyChart. We typically respond to MyChart messages within 1-2 business days.  For prescription refills, please  ask your pharmacy to contact our office. Our fax number is [phone].  If you have an urgent issue when the clinic is closed that cannot wait until the next business day, you can page your doctor at the number below.    Please note that while we do our best to be available for urgent issues outside of office hours, we are not available 24/7.   If you have an urgent issue and are unable to reach [place], you may choose to seek medical care at your doctor's  office, retail clinic, urgent care center, or emergency room.  If you have a medical emergency, please immediately call 911 or go to the emergency department.  Pager Numbers  - Dr. Gwen Pounds: [phone]  - Dr. Roseanne Reno: [phone]  - Dr. Katrinka Blazing: [phone]   In the event of inclement weather, please call our main line at [phone] for an update on the status of any delays or closures.  Dermatology Medication Tips: Please keep the boxes that topical medications come in in order to help keep track of the instructions about where and how to use these. Pharmacies typically print the medication instructions only on the boxes and not directly on the medication tubes.   If your medication is too expensive, please contact our office at [phone] option 4 or send [place] a message through MyChart.   We are unable to tell what your co-pay for medications will be in advance as this is different depending on your insurance coverage. However, we may be able to find a substitute medication at lower cost or fill out paperwork to get insurance to cover a needed medication.   If a prior authorization is required to get your medication covered by your insurance company, please allow [place] 1-2 business days to complete this process.  Drug prices often vary depending on where the prescription is filled and some pharmacies may offer cheaper prices.  The website www.goodrx.com contains coupons for medications through different pharmacies. The prices here do not account for what the cost may be with help from insurance (it may be cheaper with your insurance), but the website can give you the price if you did not use any insurance.  - You can print the associated coupon and take it with your prescription to the pharmacy.  - You may also stop by our office during regular business hours and pick up a GoodRx coupon card.  - If you need your prescription sent electronically to a different pharmacy, notify our  office through [facility] or by phone at [phone] option 4.     Si Usted Necesita Algo Despus de Su Visita  Tambin puede enviarnos un mensaje a travs de Clinical cytogeneticist. Por lo general respondemos a los mensajes de MyChart en el transcurso de 1 a 2 das hbiles.  Para renovar recetas, por favor pida a su farmacia que se ponga en contacto con nuestra oficina. Annie Sable de fax es [place] [phone].  Si tiene un asunto urgente cuando la clnica est cerrada y que no puede esperar hasta el siguiente da hbil, puede llamar/localizar a su doctor(a) al nmero que aparece a continuacin.   Por favor, tenga en cuenta que aunque hacemos todo lo posible para estar disponibles para asuntos urgentes fuera del horario de [place], no estamos disponibles las 24 horas del da, los 7 [address]  [address] de la [place].   Si tiene un problema urgente y no puede comunicarse con nosotros, puede optar por buscar atencin Sports administrator de  su doctor(a), en una clnica privada, en un centro de atencin urgente o en una sala de emergencias.  Si tiene Engineer, drilling, por favor llame inmediatamente al 911 o vaya a la sala de emergencias.  Nmeros de bper  - Dr. Gwen Pounds: [phone]  - Dra. Roseanne Reno: [phone]  - Dr. Katrinka Blazing: [phone]   En caso de inclemencias del tiempo, por favor llame a Lacy Duverney principal al [phone] para una actualizacin sobre el [place] de cualquier retraso o cierre.  Consejos para la medicacin en dermatologa: Por favor, guarde las cajas en las que vienen los medicamentos de uso tpico para ayudarle a seguir las instrucciones sobre dnde y cmo usarlos. Las farmacias generalmente imprimen las instrucciones del medicamento slo en las cajas y no directamente en los tubos del [place].   Si su medicamento es muy caro, por favor, pngase en contacto con Rolm Gala llamando al [phone] y presione la opcin 4 o envenos un mensaje a travs de Clinical cytogeneticist.    No podemos decirle cul ser su copago por los medicamentos por adelantado ya que esto es diferente dependiendo de la cobertura de su seguro. Sin embargo, es posible que podamos encontrar un medicamento sustituto a Audiological scientist un formulario para que el seguro cubra el medicamento que se considera necesario.   Si se requiere una autorizacin previa para que su compaa de seguros [place] su medicamento, por favor permtanos de 1 a 2 das hbiles para completar [address]  Los precios de los medicamentos varan con frecuencia dependiendo del Environmental consultant de dnde se surte la receta y alguna farmacias pueden ofrecer precios ms baratos.  El sitio web www.goodrx.com tiene cupones para medicamentos de Health and safety inspector. Los precios aqu no tienen en cuenta lo que podra costar con la ayuda del seguro (puede ser ms barato con su seguro), pero el sitio web puede darle el precio si no utiliz Tourist information centre manager.  - Puede imprimir el cupn correspondiente y llevarlo con su receta a la farmacia.  - Tambin puede pasar por nuestra oficina durante el horario de atencin regular y Education officer, museum una tarjeta de cupones de GoodRx.  - Si necesita que su receta se enve electrnicamente a una farmacia diferente, informe a nuestra oficina a travs de MyChart de [place] o por telfono llamando al [phone] y presione la opcin 4.

## 2023-06-24 NOTE — Progress Notes (Unsigned)
 Follow-Up Visit   Subjective  Jerome Noble is a 48 y.o. male who presents for the following: Atopic Dermatitis. Started Dupixent 03/28/2023. Patient states the Dupixent is not working. C/O dryness, cracking and pain at times. Today is a good day. Has been wearing band-aids at times. When hands are very back he will apply topical medications and wear cotton gloves to bed.    The following portions of the chart were reviewed this encounter and updated as appropriate: medications, allergies, medical history  Review of Systems:  No other skin or systemic complaints except as noted in HPI or Assessment and Plan.  Objective  Well appearing patient in no apparent distress; mood and affect are within normal limits.  Areas Examined: Hands   Relevant physical exam findings are noted in the Assessment and Plan.           Assessment & Plan   OTHER ATOPIC DERMATITIS   Related Procedures Hepatitis B surface antibody,qualitative Hepatitis B surface antigen Hepatitis C antibody HIV Antibody (routine testing w rflx) QuantiFERON-TB Gold Plus HAND DERMATITIS   Related Procedures Hepatitis B surface antibody,qualitative Hepatitis B surface antigen Hepatitis C antibody HIV Antibody (routine testing w rflx) QuantiFERON-TB Gold Plus LONG-TERM USE OF HIGH-RISK MEDICATION   Related Procedures Hepatitis B surface antibody,qualitative Hepatitis B surface antigen Hepatitis C antibody HIV Antibody (routine testing w rflx) QuantiFERON-TB Gold Plus COUNSELING AND COORDINATION OF CARE    HAND DERMATITIS/Atopic  2% BSA, affecting hands and severely harming quality of life and ability to work, failed tacrolimus, clobetasol, dupixent Exam marked xerosis, fissured plaques of palmar fingers of R hand.  Chronic and persistent condition with duration or expected duration over one year. Condition is symptomatic/ bothersome to patient. Not currently at goal.   Hand Dermatitis is a chronic  type of eczema that can come and go on the hands and fingers.  While there is no cure, the rash and symptoms can be managed with topical prescription medications, and for more severe cases, with systemic medications.  Recommend mild soap and routine use of moisturizing cream after handwashing.  Minimize soap/water exposure when possible.    Treatment Plan Patient uses gloves and does not suspect contact allergen to something at work Recommended biopsy to further characterize disease process given that Dupixent is not helping. Patient declines. Offered trial of cibinqo. Discussed that shingles vaccination is recommended before starting. Patient opts to trial Cibinqo before vaccination Cibinqo 100 mg samples given today. Do not start until we call you regarding lab results.  Patient plans to use for at least one month to see if improving. Plan to start Cibinqo 100 mg daily pending normal lab results.   Will request that PCP perform labs at [facility].   Recommend mild soap and moisturizing cream with hand washing.   Discussed potential risks/benefits of JAK inhibitors (Rinvoq/Cibinqo) which are new oral biologic medications used for treating severe, refractory atopic dermatitis.  They are very effective in resolving itchy eczema rashes of the skin.  Overall potential side effects are very minimal and mild and they are much safer than oral steroids. The black box warning for the JAK inhibitor class was discussed, including rare cardiovascular effects, thrombosis, and serious bacterial and viral infections, such as TB or herpes zoster, although the severe CV side effects have not been seen with Rinvoq or Cibinqo in their safety trials.  Shingles vaccination may be recommended prior to starting medication.  Baseline lab tests are done prior to initiation of medication to ensure  pt has no underlying lab abnormalities or infection, and then rechecked at 3 months.  If normal, then periodic doctor visits and annual  labs are performed for long term medication management.  Cibinqo 100 mg samples x2 given today Lot: [id number] Exp: 05/2024   Return in about 2 months (around 08/24/2023) for Rash Follow Up.  I, Lawson Radar, CMA, am acting as scribe for Elie Goody, MD.   Documentation: I have reviewed the above documentation for accuracy and completeness, and I agree with the above.  Elie Goody, MD

## 2023-08-13 ENCOUNTER — Other Ambulatory Visit: Payer: Self-pay

## 2023-08-13 MED ORDER — DUPIXENT 300 MG/2ML ~~LOC~~ SOAJ: 300.0000 mg | SUBCUTANEOUS | 5 refills | Status: AC

## 2023-08-13 NOTE — Progress Notes (Signed)
 Transfer to [facility] per insurance benefits. aw

## 2023-08-26 ENCOUNTER — Ambulatory Visit: Admitting: Dermatology

## 2024-04-07 ENCOUNTER — Encounter: Payer: Commercial Managed Care - HMO | Admitting: Nurse Practitioner

## 2024-04-28 ENCOUNTER — Encounter: Admitting: Nurse Practitioner

## 2024-06-19 ENCOUNTER — Encounter: Admitting: Family Medicine
